# Patient Record
Sex: Female | Born: 1945 | Race: White | Hispanic: No | Marital: Married
Health system: Southern US, Community
[De-identification: ages and names within clinical notes are randomized; demographics above are authoritative.]

---

## 2018-04-28 ENCOUNTER — Other Ambulatory Visit (HOSPITAL_COMMUNITY): Payer: Medicare Other

## 2018-04-28 ENCOUNTER — Inpatient Hospital Stay
Admission: AD | Admit: 2018-04-28 | Discharge: 2018-06-07 | Disposition: A | Discharge status: Home Health | Payer: Medicare Other | Source: Ambulatory Visit | Attending: Internal Medicine | Admitting: Internal Medicine

## 2018-04-28 DIAGNOSIS — N1330 Unspecified hydronephrosis: Secondary | ICD-10-CM

## 2018-04-28 DIAGNOSIS — T8149XA Infection following a procedure, other surgical site, initial encounter: Secondary | ICD-10-CM

## 2018-04-28 DIAGNOSIS — R109 Unspecified abdominal pain: Secondary | ICD-10-CM

## 2018-04-28 DIAGNOSIS — Z4803 Encounter for change or removal of drains: Secondary | ICD-10-CM

## 2018-04-28 DIAGNOSIS — T8143XA Infection following a procedure, organ and space surgical site, initial encounter: Secondary | ICD-10-CM

## 2018-04-28 DIAGNOSIS — L0291 Cutaneous abscess, unspecified: Secondary | ICD-10-CM

## 2018-04-28 DIAGNOSIS — Z09 Encounter for follow-up examination after completed treatment for conditions other than malignant neoplasm: Secondary | ICD-10-CM

## 2018-04-29 LAB — CBC WITH DIFFERENTIAL/PLATELET
BAND NEUTROPHILS: 36 %
BASOS ABS: 0 10*3/uL (ref 0.0–0.1)
BLASTS: 0 %
Basophils Relative: 0 %
EOS ABS: 0.3 10*3/uL (ref 0.0–0.7)
Eosinophils Relative: 2 %
HCT: 28.3 % — ABNORMAL LOW (ref 36.0–46.0)
Hemoglobin: 8.8 g/dL — ABNORMAL LOW (ref 12.0–15.0)
Lymphocytes Relative: 8 %
Lymphs Abs: 1.2 10*3/uL (ref 0.7–4.0)
MCH: 28.7 pg (ref 26.0–34.0)
MCHC: 31.1 g/dL (ref 30.0–36.0)
MCV: 92.2 fL (ref 78.0–100.0)
METAMYELOCYTES PCT: 0 %
MONOS PCT: 6 %
MYELOCYTES: 0 %
Monocytes Absolute: 0.9 10*3/uL (ref 0.1–1.0)
NEUTROS ABS: 12.3 10*3/uL — AB (ref 1.7–7.7)
Neutrophils Relative %: 48 %
Other: 0 %
PLATELETS: 719 10*3/uL — AB (ref 150–400)
Promyelocytes Relative: 0 %
RBC: 3.07 MIL/uL — ABNORMAL LOW (ref 3.87–5.11)
RDW: 15.8 % — ABNORMAL HIGH (ref 11.5–15.5)
WBC: 14.7 10*3/uL — ABNORMAL HIGH (ref 4.0–10.5)
nRBC: 3 /100 WBC — ABNORMAL HIGH

## 2018-04-29 LAB — COMPREHENSIVE METABOLIC PANEL
ALK PHOS: 58 U/L (ref 38–126)
ALT: 17 U/L (ref 14–54)
AST: 19 U/L (ref 15–41)
Albumin: 1.2 g/dL — ABNORMAL LOW (ref 3.5–5.0)
Anion gap: 9 (ref 5–15)
BILIRUBIN TOTAL: 0.4 mg/dL (ref 0.3–1.2)
BUN: 10 mg/dL (ref 6–20)
CALCIUM: 7.3 mg/dL — AB (ref 8.9–10.3)
CO2: 26 mmol/L (ref 22–32)
CREATININE: 0.41 mg/dL — AB (ref 0.44–1.00)
Chloride: 101 mmol/L (ref 101–111)
GFR calc Af Amer: >60 mL/min (ref >60)
Glucose, Bld: 159 mg/dL — ABNORMAL HIGH (ref 65–99)
Potassium: 3.8 mmol/L (ref 3.5–5.1)
Sodium: 136 mmol/L (ref 135–145)
TOTAL PROTEIN: 4.6 g/dL — AB (ref 6.5–8.1)

## 2018-04-30 LAB — TRIGLYCERIDES: TRIGLYCERIDES: 102 mg/dL (ref <150)

## 2018-04-30 LAB — COMPREHENSIVE METABOLIC PANEL
ALT: 14 U/L (ref 14–54)
ANION GAP: 7 (ref 5–15)
AST: 18 U/L (ref 15–41)
Albumin: 1 g/dL — ABNORMAL LOW (ref 3.5–5.0)
Alkaline Phosphatase: 57 U/L (ref 38–126)
BUN: 10 mg/dL (ref 6–20)
CHLORIDE: 102 mmol/L (ref 101–111)
CO2: 28 mmol/L (ref 22–32)
Calcium: 7.5 mg/dL — ABNORMAL LOW (ref 8.9–10.3)
Creatinine, Ser: 0.44 mg/dL (ref 0.44–1.00)
GFR calc non Af Amer: >60 mL/min (ref >60)
Glucose, Bld: 189 mg/dL — ABNORMAL HIGH (ref 65–99)
Potassium: 4 mmol/L (ref 3.5–5.1)
SODIUM: 137 mmol/L (ref 135–145)
Total Bilirubin: 0.1 mg/dL — ABNORMAL LOW (ref 0.3–1.2)
Total Protein: 4.6 g/dL — ABNORMAL LOW (ref 6.5–8.1)

## 2018-04-30 LAB — MAGNESIUM: Magnesium: 1.7 mg/dL (ref 1.7–2.4)

## 2018-04-30 LAB — PHOSPHORUS: PHOSPHORUS: 3.2 mg/dL (ref 2.5–4.6)

## 2018-04-30 MED ORDER — PANTOPRAZOLE SODIUM 40 MG IV SOLR
40.00 | INTRAVENOUS | Status: DC
Start: 2018-04-29 — End: 2018-04-30

## 2018-04-30 MED ORDER — ONDANSETRON 4 MG PO TBDP
4.00 | ORAL_TABLET | ORAL | Status: DC
Start: ? — End: 2018-04-30

## 2018-04-30 MED ORDER — GENERIC EXTERNAL MEDICATION
150.00 | Status: DC
Start: 2018-04-28 — End: 2018-04-30

## 2018-04-30 MED ORDER — METOPROLOL TARTRATE 25 MG PO TABS
12.50 | ORAL_TABLET | ORAL | Status: DC
Start: 2018-04-28 — End: 2018-04-30

## 2018-04-30 MED ORDER — LORAZEPAM 1 MG PO TABS
1.00 | ORAL_TABLET | ORAL | Status: DC
Start: 2018-04-28 — End: 2018-04-30

## 2018-04-30 MED ORDER — DEXTROSE 10 % IV SOLN
125.00 | INTRAVENOUS | Status: DC
Start: ? — End: 2018-04-30

## 2018-04-30 MED ORDER — GENERIC EXTERNAL MEDICATION
Status: DC
Start: ? — End: 2018-04-30

## 2018-04-30 MED ORDER — ACETAMINOPHEN 500 MG PO TABS
1000.00 | ORAL_TABLET | ORAL | Status: DC
Start: 2018-04-28 — End: 2018-04-30

## 2018-04-30 MED ORDER — PNEUMOCOCCAL 13-VAL CONJ VACC IM SUSP
0.50 | INTRAMUSCULAR | Status: DC
Start: ? — End: 2018-04-30

## 2018-04-30 MED ORDER — OXYCODONE HCL 5 MG PO TABS
5.00 | ORAL_TABLET | ORAL | Status: DC
Start: ? — End: 2018-04-30

## 2018-04-30 MED ORDER — ENOXAPARIN SODIUM 40 MG/0.4ML ~~LOC~~ SOLN
40.00 | SUBCUTANEOUS | Status: DC
Start: 2018-04-29 — End: 2018-04-30

## 2018-04-30 MED ORDER — INSULIN LISPRO 100 UNIT/ML ~~LOC~~ SOLN
2.00 | SUBCUTANEOUS | Status: DC
Start: 2018-04-28 — End: 2018-04-30

## 2018-04-30 MED ORDER — SODIUM CHLORIDE 0.9 % IJ SOLN
20.00 | INTRAMUSCULAR | Status: DC
Start: 2018-04-29 — End: 2018-04-30

## 2018-04-30 MED ORDER — GENERIC EXTERNAL MEDICATION
2.00 g | Status: DC
Start: 2018-04-29 — End: 2018-04-30

## 2018-04-30 MED ORDER — METRONIDAZOLE IN NACL 5-0.79 MG/ML-% IV SOLN
500.00 | INTRAVENOUS | Status: DC
Start: 2018-04-28 — End: 2018-04-30

## 2018-04-30 MED ORDER — SERTRALINE HCL 100 MG PO TABS
100.00 | ORAL_TABLET | ORAL | Status: DC
Start: 2018-04-29 — End: 2018-04-30

## 2018-04-30 MED ORDER — GENERIC EXTERNAL MEDICATION
5.00 | Status: DC
Start: ? — End: 2018-04-30

## 2018-04-30 MED ORDER — SODIUM CHLORIDE 0.9 % IJ SOLN
20.00 | INTRAMUSCULAR | Status: DC
Start: ? — End: 2018-04-30

## 2018-04-30 MED ORDER — GENERIC EXTERNAL MEDICATION
.50 | Status: DC
Start: ? — End: 2018-04-30

## 2018-05-01 LAB — BASIC METABOLIC PANEL
Anion gap: 5 (ref 5–15)
BUN: 12 mg/dL (ref 6–20)
CALCIUM: 7.5 mg/dL — AB (ref 8.9–10.3)
CO2: 28 mmol/L (ref 22–32)
CREATININE: 0.38 mg/dL — AB (ref 0.44–1.00)
Chloride: 104 mmol/L (ref 101–111)
GFR calc Af Amer: >60 mL/min (ref >60)
Glucose, Bld: 199 mg/dL — ABNORMAL HIGH (ref 65–99)
Potassium: 3.9 mmol/L (ref 3.5–5.1)
Sodium: 137 mmol/L (ref 135–145)

## 2018-05-02 LAB — BASIC METABOLIC PANEL
Anion gap: 7 (ref 5–15)
BUN: 11 mg/dL (ref 6–20)
CALCIUM: 7.7 mg/dL — AB (ref 8.9–10.3)
CO2: 29 mmol/L (ref 22–32)
CREATININE: 0.39 mg/dL — AB (ref 0.44–1.00)
Chloride: 102 mmol/L (ref 101–111)
GFR calc Af Amer: >60 mL/min (ref >60)
Glucose, Bld: 145 mg/dL — ABNORMAL HIGH (ref 65–99)
Potassium: 4.1 mmol/L (ref 3.5–5.1)
Sodium: 138 mmol/L (ref 135–145)

## 2018-05-03 LAB — COMPREHENSIVE METABOLIC PANEL
ALT: 12 U/L — AB (ref 14–54)
AST: 16 U/L (ref 15–41)
Albumin: 1.2 g/dL — ABNORMAL LOW (ref 3.5–5.0)
Alkaline Phosphatase: 67 U/L (ref 38–126)
Anion gap: 7 (ref 5–15)
BILIRUBIN TOTAL: 0.1 mg/dL — AB (ref 0.3–1.2)
BUN: 14 mg/dL (ref 6–20)
CHLORIDE: 100 mmol/L — AB (ref 101–111)
CO2: 29 mmol/L (ref 22–32)
CREATININE: 0.37 mg/dL — AB (ref 0.44–1.00)
Calcium: 7.5 mg/dL — ABNORMAL LOW (ref 8.9–10.3)
Glucose, Bld: 155 mg/dL — ABNORMAL HIGH (ref 65–99)
POTASSIUM: 4.2 mmol/L (ref 3.5–5.1)
Sodium: 136 mmol/L (ref 135–145)
Total Protein: 4.5 g/dL — ABNORMAL LOW (ref 6.5–8.1)

## 2018-05-03 LAB — TRIGLYCERIDES: Triglycerides: 77 mg/dL (ref <150)

## 2018-05-03 LAB — MAGNESIUM: MAGNESIUM: 1.6 mg/dL — AB (ref 1.7–2.4)

## 2018-05-03 LAB — PHOSPHORUS: PHOSPHORUS: 3.2 mg/dL (ref 2.5–4.6)

## 2018-05-06 LAB — COMPREHENSIVE METABOLIC PANEL
ALK PHOS: 63 U/L (ref 38–126)
ALT: 10 U/L — AB (ref 14–54)
AST: 13 U/L — AB (ref 15–41)
Albumin: 1.2 g/dL — ABNORMAL LOW (ref 3.5–5.0)
Anion gap: 7 (ref 5–15)
BUN: 11 mg/dL (ref 6–20)
CALCIUM: 7.6 mg/dL — AB (ref 8.9–10.3)
CO2: 30 mmol/L (ref 22–32)
CREATININE: 0.44 mg/dL (ref 0.44–1.00)
Chloride: 99 mmol/L — ABNORMAL LOW (ref 101–111)
GFR calc non Af Amer: >60 mL/min (ref >60)
GLUCOSE: 163 mg/dL — AB (ref 65–99)
Potassium: 4 mmol/L (ref 3.5–5.1)
Sodium: 136 mmol/L (ref 135–145)
Total Bilirubin: 0.2 mg/dL — ABNORMAL LOW (ref 0.3–1.2)
Total Protein: 4.6 g/dL — ABNORMAL LOW (ref 6.5–8.1)

## 2018-05-06 LAB — PHOSPHORUS: PHOSPHORUS: 3.5 mg/dL (ref 2.5–4.6)

## 2018-05-06 LAB — TRIGLYCERIDES: TRIGLYCERIDES: 52 mg/dL (ref <150)

## 2018-05-06 LAB — MAGNESIUM: Magnesium: 1.6 mg/dL — ABNORMAL LOW (ref 1.7–2.4)

## 2018-05-08 ENCOUNTER — Other Ambulatory Visit (HOSPITAL_COMMUNITY): Payer: Medicare Other

## 2018-05-08 MED ORDER — IOHEXOL 300 MG/ML  SOLN
100.0000 mL | Freq: Once | INTRAMUSCULAR | Status: AC | PRN
  Administered 2018-05-08: 100 mL via INTRAVENOUS

## 2018-05-08 MED ORDER — IOPAMIDOL (ISOVUE-300) INJECTION 61%
INTRAVENOUS | Status: AC
  Filled 2018-05-08: qty 30

## 2018-05-09 LAB — COMPREHENSIVE METABOLIC PANEL
ALT: 8 U/L — ABNORMAL LOW (ref 14–54)
AST: 13 U/L — ABNORMAL LOW (ref 15–41)
Albumin: 1.4 g/dL — ABNORMAL LOW (ref 3.5–5.0)
Alkaline Phosphatase: 72 U/L (ref 38–126)
Anion gap: 8 (ref 5–15)
BUN: 14 mg/dL (ref 6–20)
CHLORIDE: 98 mmol/L — AB (ref 101–111)
CO2: 30 mmol/L (ref 22–32)
Calcium: 8 mg/dL — ABNORMAL LOW (ref 8.9–10.3)
Creatinine, Ser: 0.46 mg/dL (ref 0.44–1.00)
GFR calc non Af Amer: >60 mL/min (ref >60)
Glucose, Bld: 151 mg/dL — ABNORMAL HIGH (ref 65–99)
POTASSIUM: 4.2 mmol/L (ref 3.5–5.1)
SODIUM: 136 mmol/L (ref 135–145)
Total Bilirubin: 0.3 mg/dL (ref 0.3–1.2)
Total Protein: 5.2 g/dL — ABNORMAL LOW (ref 6.5–8.1)

## 2018-05-09 LAB — CBC
HCT: 26.9 % — ABNORMAL LOW (ref 36.0–46.0)
HEMOGLOBIN: 8.2 g/dL — AB (ref 12.0–15.0)
MCH: 27.2 pg (ref 26.0–34.0)
MCHC: 30.5 g/dL (ref 30.0–36.0)
MCV: 89.1 fL (ref 78.0–100.0)
Platelets: 763 10*3/uL — ABNORMAL HIGH (ref 150–400)
RBC: 3.02 MIL/uL — ABNORMAL LOW (ref 3.87–5.11)
RDW: 15.5 % (ref 11.5–15.5)
WBC: 11.9 10*3/uL — ABNORMAL HIGH (ref 4.0–10.5)

## 2018-05-09 LAB — MAGNESIUM: Magnesium: 1.6 mg/dL — ABNORMAL LOW (ref 1.7–2.4)

## 2018-05-09 LAB — PHOSPHORUS: PHOSPHORUS: 3.9 mg/dL (ref 2.5–4.6)

## 2018-05-09 LAB — TRIGLYCERIDES: Triglycerides: 62 mg/dL (ref <150)

## 2018-05-09 NOTE — Progress Notes (Signed)
IR consulted for drain repositioning.  Patient with several drains recently placed at [facility].  CT Abdomen/Pelvis reviewed by Dr. Bonnielee Haff.  Note possible mild mass-effect from right-sided drain.  Dr. Bonnielee Haff recommends continuing aggressive flushes for now and monitor for improvement.   Ordering MD aware.  Loyce Dys, MS RD PA-C 4:11 PM

## 2018-05-10 LAB — BASIC METABOLIC PANEL
ANION GAP: 8 (ref 5–15)
BUN: 11 mg/dL (ref 6–20)
CALCIUM: 7.8 mg/dL — AB (ref 8.9–10.3)
CO2: 28 mmol/L (ref 22–32)
Chloride: 99 mmol/L — ABNORMAL LOW (ref 101–111)
Creatinine, Ser: 0.44 mg/dL (ref 0.44–1.00)
GFR calc non Af Amer: >60 mL/min (ref >60)
Glucose, Bld: 191 mg/dL — ABNORMAL HIGH (ref 65–99)
Potassium: 4 mmol/L (ref 3.5–5.1)
SODIUM: 135 mmol/L (ref 135–145)

## 2018-05-12 LAB — COMPREHENSIVE METABOLIC PANEL
ALBUMIN: 1.4 g/dL — AB (ref 3.5–5.0)
ALT: 8 U/L — AB (ref 14–54)
AST: 12 U/L — ABNORMAL LOW (ref 15–41)
Alkaline Phosphatase: 73 U/L (ref 38–126)
Anion gap: 7 (ref 5–15)
BUN: 11 mg/dL (ref 6–20)
CHLORIDE: 100 mmol/L — AB (ref 101–111)
CO2: 29 mmol/L (ref 22–32)
CREATININE: 0.35 mg/dL — AB (ref 0.44–1.00)
Calcium: 7.8 mg/dL — ABNORMAL LOW (ref 8.9–10.3)
GFR calc Af Amer: >60 mL/min (ref >60)
GFR calc non Af Amer: >60 mL/min (ref >60)
Glucose, Bld: 146 mg/dL — ABNORMAL HIGH (ref 65–99)
Potassium: 4.1 mmol/L (ref 3.5–5.1)
SODIUM: 136 mmol/L (ref 135–145)
Total Bilirubin: 0.4 mg/dL (ref 0.3–1.2)
Total Protein: 5 g/dL — ABNORMAL LOW (ref 6.5–8.1)

## 2018-05-12 LAB — MAGNESIUM: Magnesium: 1.6 mg/dL — ABNORMAL LOW (ref 1.7–2.4)

## 2018-05-12 LAB — PHOSPHORUS: PHOSPHORUS: 3.8 mg/dL (ref 2.5–4.6)

## 2018-05-12 LAB — TRIGLYCERIDES: Triglycerides: 49 mg/dL (ref <150)

## 2018-05-14 ENCOUNTER — Other Ambulatory Visit (HOSPITAL_COMMUNITY): Payer: Medicare Other

## 2018-05-14 MED ORDER — IOHEXOL 300 MG/ML  SOLN
100.0000 mL | Freq: Once | INTRAMUSCULAR | Status: AC | PRN
  Administered 2018-05-14: 100 mL via INTRAVENOUS

## 2018-05-15 LAB — COMPREHENSIVE METABOLIC PANEL
ALK PHOS: 75 U/L (ref 38–126)
ALT: 7 U/L — ABNORMAL LOW (ref 14–54)
ANION GAP: 6 (ref 5–15)
AST: 12 U/L — ABNORMAL LOW (ref 15–41)
Albumin: 1.5 g/dL — ABNORMAL LOW (ref 3.5–5.0)
BILIRUBIN TOTAL: 0.3 mg/dL (ref 0.3–1.2)
BUN: 12 mg/dL (ref 6–20)
CALCIUM: 7.8 mg/dL — AB (ref 8.9–10.3)
CO2: 30 mmol/L (ref 22–32)
Chloride: 98 mmol/L — ABNORMAL LOW (ref 101–111)
Creatinine, Ser: 0.38 mg/dL — ABNORMAL LOW (ref 0.44–1.00)
GFR calc non Af Amer: >60 mL/min (ref >60)
Glucose, Bld: 145 mg/dL — ABNORMAL HIGH (ref 65–99)
Potassium: 4.1 mmol/L (ref 3.5–5.1)
Sodium: 134 mmol/L — ABNORMAL LOW (ref 135–145)
TOTAL PROTEIN: 5.1 g/dL — AB (ref 6.5–8.1)

## 2018-05-15 LAB — CBC WITH DIFFERENTIAL/PLATELET
BASOS ABS: 0.1 10*3/uL (ref 0.0–0.1)
Basophils Relative: 1 %
EOS ABS: 0.1 10*3/uL (ref 0.0–0.7)
Eosinophils Relative: 1 %
HCT: 26.3 % — ABNORMAL LOW (ref 36.0–46.0)
Hemoglobin: 7.9 g/dL — ABNORMAL LOW (ref 12.0–15.0)
LYMPHS ABS: 1.3 10*3/uL (ref 0.7–4.0)
Lymphocytes Relative: 11 %
MCH: 26.2 pg (ref 26.0–34.0)
MCHC: 30 g/dL (ref 30.0–36.0)
MCV: 87.4 fL (ref 78.0–100.0)
Monocytes Absolute: 1.6 10*3/uL — ABNORMAL HIGH (ref 0.1–1.0)
Monocytes Relative: 14 %
Neutro Abs: 8.5 10*3/uL — ABNORMAL HIGH (ref 1.7–7.7)
Neutrophils Relative %: 73 %
PLATELETS: 812 10*3/uL — AB (ref 150–400)
RBC: 3.01 MIL/uL — AB (ref 3.87–5.11)
RDW: 16.4 % — ABNORMAL HIGH (ref 11.5–15.5)
WBC: 11.6 10*3/uL — AB (ref 4.0–10.5)

## 2018-05-15 LAB — MAGNESIUM: MAGNESIUM: 1.6 mg/dL — AB (ref 1.7–2.4)

## 2018-05-15 LAB — PHOSPHORUS: PHOSPHORUS: 3.9 mg/dL (ref 2.5–4.6)

## 2018-05-15 LAB — TRIGLYCERIDES: Triglycerides: 69 mg/dL (ref <150)

## 2018-05-18 ENCOUNTER — Other Ambulatory Visit (HOSPITAL_COMMUNITY): Payer: Medicare Other

## 2018-05-21 ENCOUNTER — Encounter (HOSPITAL_COMMUNITY): Payer: Self-pay | Admitting: Interventional Radiology

## 2018-05-21 ENCOUNTER — Other Ambulatory Visit (HOSPITAL_COMMUNITY): Payer: Medicare Other

## 2018-05-21 HISTORY — PX: IR SINUS/FIST TUBE CHK-NON GI: IMG673

## 2018-05-21 LAB — CBC
HEMATOCRIT: 29.3 % — AB (ref 36.0–46.0)
Hemoglobin: 8.7 g/dL — ABNORMAL LOW (ref 12.0–15.0)
MCH: 25.6 pg — ABNORMAL LOW (ref 26.0–34.0)
MCHC: 29.7 g/dL — ABNORMAL LOW (ref 30.0–36.0)
MCV: 86.2 fL (ref 78.0–100.0)
Platelets: 1005 10*3/uL (ref 150–400)
RBC: 3.4 MIL/uL — AB (ref 3.87–5.11)
RDW: 16.7 % — AB (ref 11.5–15.5)
WBC: 13.7 10*3/uL — AB (ref 4.0–10.5)

## 2018-05-21 LAB — BASIC METABOLIC PANEL
ANION GAP: 7 (ref 5–15)
BUN: 15 mg/dL (ref 6–20)
CALCIUM: 8.3 mg/dL — AB (ref 8.9–10.3)
CO2: 28 mmol/L (ref 22–32)
Chloride: 102 mmol/L (ref 101–111)
Creatinine, Ser: 0.42 mg/dL — ABNORMAL LOW (ref 0.44–1.00)
Glucose, Bld: 146 mg/dL — ABNORMAL HIGH (ref 65–99)
Potassium: 4.1 mmol/L (ref 3.5–5.1)
Sodium: 137 mmol/L (ref 135–145)

## 2018-05-21 MED ORDER — IOPAMIDOL (ISOVUE-300) INJECTION 61%
INTRAVENOUS | Status: AC
  Administered 2018-05-21: 25 mL
  Filled 2018-05-21: qty 50

## 2018-05-21 NOTE — Procedures (Signed)
Pre procedural Dx: Post-op Abscess Post procedural Dx: Same  Fluoro guided injection of right mid abdominal percutaneous drain is negative for evidence of fistulous connection between the presumed decompressed abscess cavity and either the adjacent intestines or biliary system.  PLAN:  - Given report of persistent high volume daily output from the percutaneous drainage catheter estimated approximately 300 - 500 cc per day, drainage catheter removal is NOT advised at this time.  - As such, we will convert the patient's drainage catheter suction canister to a gravity bag and will continue to monitor the daily output from the drainage catheter.    - If the drainage catheter output demonstrates a precipitous drop-off in the coming days, the drainage catheter may be removed at that time.    - If drainage catheter output remains voluminous, would recommend repeat CT scan and potential repeat drainage catheter injection prior to consideration of drainage catheter removal.   Katherina RightJay Diamante Truszkowski, MD Pager #: [phone]

## 2018-05-23 LAB — PATHOLOGIST SMEAR REVIEW

## 2018-05-24 ENCOUNTER — Other Ambulatory Visit (HOSPITAL_COMMUNITY): Payer: Medicare Other

## 2018-05-24 LAB — CBC WITH DIFFERENTIAL/PLATELET
BASOS ABS: 0.2 10*3/uL — AB (ref 0.0–0.1)
Basophils Relative: 1 %
EOS ABS: 0.2 10*3/uL (ref 0.0–0.7)
EOS PCT: 1 %
HCT: 24.4 % — ABNORMAL LOW (ref 36.0–46.0)
HEMOGLOBIN: 7.3 g/dL — AB (ref 12.0–15.0)
LYMPHS PCT: 6 %
Lymphs Abs: 1.2 10*3/uL (ref 0.7–4.0)
MCH: 25.3 pg — ABNORMAL LOW (ref 26.0–34.0)
MCHC: 29.9 g/dL — ABNORMAL LOW (ref 30.0–36.0)
MCV: 84.7 fL (ref 78.0–100.0)
MONOS PCT: 13 %
Monocytes Absolute: 2.6 10*3/uL — ABNORMAL HIGH (ref 0.1–1.0)
Neutro Abs: 15.5 10*3/uL — ABNORMAL HIGH (ref 1.7–7.7)
Neutrophils Relative %: 79 %
PLATELETS: 735 10*3/uL — AB (ref 150–400)
RBC: 2.88 MIL/uL — AB (ref 3.87–5.11)
RDW: 17.2 % — ABNORMAL HIGH (ref 11.5–15.5)
WBC: 19.7 10*3/uL — AB (ref 4.0–10.5)
nRBC: 1 /100 WBC — ABNORMAL HIGH

## 2018-05-25 ENCOUNTER — Other Ambulatory Visit (HOSPITAL_COMMUNITY): Payer: Medicare Other

## 2018-05-25 MED ORDER — IOHEXOL 300 MG/ML  SOLN
100.0000 mL | Freq: Once | INTRAMUSCULAR | Status: AC | PRN
  Administered 2018-05-25: 100 mL via INTRAVENOUS

## 2018-05-26 ENCOUNTER — Other Ambulatory Visit (HOSPITAL_COMMUNITY): Payer: Medicare Other

## 2018-05-26 LAB — CBC
HEMATOCRIT: 21.5 % — AB (ref 36.0–46.0)
Hemoglobin: 6.5 g/dL — CL (ref 12.0–15.0)
MCH: 25.5 pg — ABNORMAL LOW (ref 26.0–34.0)
MCHC: 30.2 g/dL (ref 30.0–36.0)
MCV: 84.3 fL (ref 78.0–100.0)
Platelets: 656 10*3/uL — ABNORMAL HIGH (ref 150–400)
RBC: 2.55 MIL/uL — AB (ref 3.87–5.11)
RDW: 17.7 % — ABNORMAL HIGH (ref 11.5–15.5)
WBC: 21 10*3/uL — AB (ref 4.0–10.5)

## 2018-05-26 LAB — BASIC METABOLIC PANEL
ANION GAP: 8 (ref 5–15)
BUN: 15 mg/dL (ref 6–20)
CHLORIDE: 101 mmol/L (ref 101–111)
CO2: 27 mmol/L (ref 22–32)
Calcium: 7.9 mg/dL — ABNORMAL LOW (ref 8.9–10.3)
Creatinine, Ser: 0.39 mg/dL — ABNORMAL LOW (ref 0.44–1.00)
GFR calc Af Amer: >60 mL/min (ref >60)
GLUCOSE: 171 mg/dL — AB (ref 65–99)
POTASSIUM: 4.1 mmol/L (ref 3.5–5.1)
Sodium: 136 mmol/L (ref 135–145)

## 2018-05-26 LAB — ABO/RH: ABO/RH(D): A POS

## 2018-05-26 LAB — PROTIME-INR
INR: 1.23
Prothrombin Time: 15.4 seconds — ABNORMAL HIGH (ref 11.4–15.2)

## 2018-05-26 LAB — PREPARE RBC (CROSSMATCH)

## 2018-05-26 MED ORDER — FENTANYL CITRATE (PF) 100 MCG/2ML IJ SOLN
INTRAMUSCULAR | Status: AC | PRN
  Administered 2018-05-26: 25 ug via INTRAVENOUS
  Administered 2018-05-26: 50 ug via INTRAVENOUS

## 2018-05-26 MED ORDER — MIDAZOLAM HCL 2 MG/2ML IJ SOLN
INTRAMUSCULAR | Status: AC | PRN
  Administered 2018-05-26: 0.5 mg via INTRAVENOUS
  Administered 2018-05-26: 1 mg via INTRAVENOUS
  Administered 2018-05-26: 0.5 mg via INTRAVENOUS

## 2018-05-26 MED ORDER — FENTANYL CITRATE (PF) 100 MCG/2ML IJ SOLN
INTRAMUSCULAR | Status: AC
  Filled 2018-05-26: qty 4

## 2018-05-26 MED ORDER — MIDAZOLAM HCL 2 MG/2ML IJ SOLN
INTRAMUSCULAR | Status: AC
  Filled 2018-05-26: qty 4

## 2018-05-26 NOTE — Sedation Documentation (Signed)
Vital signs stable. 

## 2018-05-26 NOTE — Sedation Documentation (Signed)
Patient is resting comfortably. 

## 2018-05-26 NOTE — Procedures (Signed)
CT guided drain placement in lower abdominal/pelvic air-fluid collection.   Removed 80 ml of dark green/gray fluid.  Minimal blood loss and no immediate complication.

## 2018-05-26 NOTE — Sedation Documentation (Signed)
ED Provider at bedside. 

## 2018-05-26 NOTE — Progress Notes (Signed)
Referring Physician(s): Brown,S  Supervising Physician: Richarda Overlie  Patient Status:  [name]  Chief Complaint:  Abdominal abscesses  Subjective: Patient familiar to IR service from recent abdominal abscess drain injection on 05/21/2018.  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  She underwent placement of both left and right abdominal drains for abscesses.  She recently had a left abdominal drain removed. She continues to have abdominal pain and intermittent fevers and underwent CT of abdomen pelvis yesterday which revealed large recurrent abscess in the mid abdominal/pelvic region.  Request now received for drainage of this abscess.  She currently denies headache, chest pain, worsening dyspnea, cough, back pain, nausea, vomiting or bleeding.  Allergies: Patient has no allergy information on record.  Medications: Prior to Admission medications   Not on File     Vital Signs: There were no vitals taken for this visit.  Physical Exam awake, alert.  Chest with diminished breath sounds at bases.  Heart with regular rate and rhythm.  Abdomen soft, positive bowel sounds, midline vertical wound covered with bandage.  Right abdominal drain with bilious appearing output, left abdominal drain with dark/black fluid in JP bulb. No LE edema.  Imaging: Ct Abdomen Pelvis W Contrast  Result Date: 05/25/2018 CLINICAL DATA:  Abdominal pain and fever. Patient with history of intra-abdominal collections and drains. Increased white blood cell count. EXAM: CT ABDOMEN AND PELVIS WITH CONTRAST TECHNIQUE: Multidetector CT imaging of the abdomen and pelvis was performed using the standard protocol following bolus administration of intravenous contrast. CONTRAST:  OMNIPAQUE IOHEXOL 300 MG/ML  SOLN COMPARISON:  CT 05/14/2018 FINDINGS: Lower chest: Small left pleural effusion  and adjacent atelectasis, slightly improved from prior exam. Mild right lung base atelectasis. Hepatobiliary: Subcentimeter low-density in the central liver is unchanged. No new focal lesion. Gallbladder not confidently visualized. Pancreas: No ductal dilatation or inflammation. Tiny 4 mm low-density cystic structure in the tail is unchanged. Spleen: Prior splenectomy. Possible splenosis in the left upper quadrant versus pancreatic tail. Adrenals/Urinary Tract: No adrenal nodule. Mild right hydronephrosis and proximal ureteral dilatation to the pelvic brim, however there is symmetric renal enhancement and excretion on delayed phase imaging. The ureter distal to the pelvic brim is not well evaluated. Nonobstructing stone in the lower left kidney. Urinary bladder is physiologically distended. Stomach/Bowel: Postsurgical change in lack of enteric contrast significantly limits bowel evaluation. Bowel anatomy is not well-defined. Stomach is nondistended. Fluid-filled prominent but not particularly dilated small bowel in the central abdomen. Enteric sutures noted in the transverse colon. Liquid stool/fluid-filled colon without colonic wall thickening or inflammatory change. There is an apparent colonic wall defect within the distal sigmoid colon, coronal image 56 and sagittal image 51, with apparent extraluminal air and fluid collection, this collection measures approximately 6.3 x 3.4 x 7.5 cm. This is in the region of prior pelvic drain, which has been removed in the interim. Vascular/Lymphatic: Aortic atherosclerosis. Circumaortic left renal vein. Overall limited assessment for adenopathy. Reproductive: Uterus is not visualized. Other: Large peripherally enhancing U shaped pelvic fluid collection measures at least 15.5 x 4.1 x 4.9 cm, in the region of prior surgical drain consistent with abscess. This fluid collection tracks into the left pericolic gutter and left upper quadrant, and is in continuity with the left  upper quadrant drainage catheter. Abscess cavity in the left upper quadrant measures 8.6 x 4.7 cm. Right lateral aspect of the  fluid collection tracks into the right pericolic gutter. Separate drainage catheter in the right upper quadrant has small residual fluid collection measuring 2.5 x 2.0 cm. There is scattered free air in the anterior upper abdomen. Fluid tracks into both inguinal canals, sterility indeterminate by CT. Midline skin staples. Musculoskeletal: Degenerative change in the spine. No focal bone lesion. IMPRESSION: 1. Large recurrent pelvic abscess after drainage catheter removal. The abscess is U shaped and measures at least 15 x 4 x 5 cm. The left lateral aspect of this abscess is in continuity with a fluid collection tracking in the pericolic gutter into the left upper quadrant. There is a drainage catheter within the left upper quadrant fluid collection with residual abscess cavity of 8.6 x 4.7 cm. 2. Right upper quadrant drainage catheter with small residual adjacent fluid collection measuring 2.5 x 2.0 cm. 3. Deep pelvic abscess posterior to the bladder measures 6.3 x 3.4 x 7.5 cm. 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 4. Persistent mild right hydroureteronephrosis to the pelvic brim, raising possibility of ureteral stricture. These results will be called to the ordering clinician or representative by the Radiologist Assistant, and communication documented in the PACS or zVision Dashboard. Electronically Signed   By: Rubye Oaks M.D.   On: 05/25/2018 01:16    Labs:  CBC: Recent Labs    05/15/18 0514 05/21/18 0934 05/24/18 1620 05/26/18 0615  WBC 11.6* 13.7* 19.7* 21.0*  HGB 7.9* 8.7* 7.3* 6.5*  HCT 26.3* 29.3* 24.4* 21.5*  PLT 812* 1,005* 735* 656*    COAGS: Recent Labs    05/26/18 0648  INR 1.23    BMP: Recent Labs    05/12/18 0530 05/15/18 0514 05/21/18 0934 05/26/18 0615  NA 136 134* 137 136   K 4.1 4.1 4.1 4.1  CL 100* 98* 102 101  CO2 29 30 28 27   GLUCOSE 146* 145* 146* 171*  BUN 11 12 15 15   CALCIUM 7.8* 7.8* 8.3* 7.9*  CREATININE 0.35* 0.38* 0.42* 0.39*  GFRNONAA >60 >60 >60 >60  GFRAA >60 >60 >60 >60    LIVER FUNCTION TESTS: Recent Labs    05/06/18 0646 05/09/18 0535 05/12/18 0530 05/15/18 0514  BILITOT 0.2* 0.3 0.4 0.3  AST 13* 13* 12* 12*  ALT 10* 8* 8* 7*  ALKPHOS 63 72 73 75  PROT 4.6* 5.2* 5.0* 5.1*  ALBUMIN 1.2* 1.4* 1.4* 1.5*    Assessment and Plan: Pt with prior history of appendiceal carcinoma, status post resection with 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 Also with history of previous right and left abdominal abscess drain placements and recent removal of a left-sided abd drain; now with persistent abdominal pain, intermittent fever, nausea, worsening leukocytosis and CT evidence of a large recurrent abscess in the mid pelvic region.  Request now received for image guided drainage of this abscess.  Latest imaging studies have been reviewed by Dr. Lowella Dandy. Risks and benefits discussed with the patient /spouse including bleeding, infection, damage to adjacent structures, bowel perforation/fistula connection, and sepsis.  Hemoglobin today 6.5.  Patient has been transfused.  All of the patient's questions were answered, patient is agreeable to proceed. Consent signed and in chart.  Procedure tentatively scheduled for later today.   Electronically Signed: D. Jeananne Rama, PA-C 05/26/2018, 10:04 AM   I spent a total of 25 minutes at the the patient's bedside AND on the patient's hospital floor or  unit, greater than 50% of which was counseling/coordinating care for CT-guided abdominal/pelvic abscess drain    Patient ID: Summer Espinoza, female   DOB: 08/23/1946, 72 y.o.   MRN: [id number]

## 2018-05-27 LAB — BASIC METABOLIC PANEL
ANION GAP: 6 (ref 5–15)
BUN: 13 mg/dL (ref 6–20)
CHLORIDE: 102 mmol/L (ref 101–111)
CO2: 29 mmol/L (ref 22–32)
Calcium: 7.7 mg/dL — ABNORMAL LOW (ref 8.9–10.3)
Creatinine, Ser: 0.41 mg/dL — ABNORMAL LOW (ref 0.44–1.00)
Glucose, Bld: 145 mg/dL — ABNORMAL HIGH (ref 65–99)
POTASSIUM: 4.2 mmol/L (ref 3.5–5.1)
SODIUM: 137 mmol/L (ref 135–145)

## 2018-05-27 LAB — CBC
HCT: 26.5 % — ABNORMAL LOW (ref 36.0–46.0)
Hemoglobin: 8.1 g/dL — ABNORMAL LOW (ref 12.0–15.0)
MCH: 25.8 pg — ABNORMAL LOW (ref 26.0–34.0)
MCHC: 30.6 g/dL (ref 30.0–36.0)
MCV: 84.4 fL (ref 78.0–100.0)
Platelets: 667 10*3/uL — ABNORMAL HIGH (ref 150–400)
RBC: 3.14 MIL/uL — AB (ref 3.87–5.11)
RDW: 17.6 % — AB (ref 11.5–15.5)
WBC: 22.6 10*3/uL — AB (ref 4.0–10.5)

## 2018-05-27 LAB — BPAM RBC
BLOOD PRODUCT EXPIRATION DATE: 201906282359
ISSUE DATE / TIME: 201906061047
Unit Type and Rh: 6200

## 2018-05-27 LAB — TYPE AND SCREEN
ABO/RH(D): A POS
ANTIBODY SCREEN: NEGATIVE
Unit division: 0

## 2018-05-27 NOTE — Progress Notes (Signed)
Referring Physician(s): Dr. Manson Passey  Supervising Physician: Richarda Overlie  Patient Status:  [facility] - In-pt  Chief Complaint: Abdominal abscesses  Subjective: Resting comfortably, arouses easily.  Patient s/p abdominal abscess drain placement 6/6. Still with multiple other drains as well.   Allergies: Patient has no allergy information on record.  Medications: Prior to Admission medications   Not on File     Vital Signs: BP (!) 96/49   Pulse 84   Resp 18   SpO2 98%   Physical Exam  NAD, alert, communicative Abdomen: multiple abdominal drain in place.  RLQ drain placed by IR yesterday which terminates in the left abdomen.  Site intact.  Output remains dark gray.     Imaging: Ct Abdomen Pelvis W Contrast  Result Date: 05/25/2018 CLINICAL DATA:  Abdominal pain and fever. Patient with history of intra-abdominal collections and drains. Increased white blood cell count. EXAM: CT ABDOMEN AND PELVIS WITH CONTRAST TECHNIQUE: Multidetector CT imaging of the abdomen and pelvis was performed using the standard protocol following bolus administration of intravenous contrast. CONTRAST:  OMNIPAQUE IOHEXOL 300 MG/ML  SOLN COMPARISON:  CT 05/14/2018 FINDINGS: Lower chest: Small left pleural effusion and adjacent atelectasis, slightly improved from prior exam. Mild right lung base atelectasis. Hepatobiliary: Subcentimeter low-density in the central liver is unchanged. No new focal lesion. Gallbladder not confidently visualized. Pancreas: No ductal dilatation or inflammation. Tiny 4 mm low-density cystic structure in the tail is unchanged. Spleen: Prior splenectomy. Possible splenosis in the left upper quadrant versus pancreatic tail. Adrenals/Urinary Tract: No adrenal nodule. Mild right hydronephrosis and proximal ureteral dilatation to the pelvic brim, however there is symmetric renal enhancement and excretion on delayed phase imaging. The ureter distal to the pelvic brim is not well evaluated.  Nonobstructing stone in the lower left kidney. Urinary bladder is physiologically distended. Stomach/Bowel: Postsurgical change in lack of enteric contrast significantly limits bowel evaluation. Bowel anatomy is not well-defined. Stomach is nondistended. Fluid-filled prominent but not particularly dilated small bowel in the central abdomen. Enteric sutures noted in the transverse colon. Liquid stool/fluid-filled colon without colonic wall thickening or inflammatory change. There is an apparent colonic wall defect within the distal sigmoid colon, coronal image 56 and sagittal image 51, with apparent extraluminal air and fluid collection, this collection measures approximately 6.3 x 3.4 x 7.5 cm. This is in the region of prior pelvic drain, which has been removed in the interim. Vascular/Lymphatic: Aortic atherosclerosis. Circumaortic left renal vein. Overall limited assessment for adenopathy. Reproductive: Uterus is not visualized. Other: Large peripherally enhancing U shaped pelvic fluid collection measures at least 15.5 x 4.1 x 4.9 cm, in the region of prior surgical drain consistent with abscess. This fluid collection tracks into the left pericolic gutter and left upper quadrant, and is in continuity with the left upper quadrant drainage catheter. Abscess cavity in the left upper quadrant measures 8.6 x 4.7 cm. Right lateral aspect of the fluid collection tracks into the right pericolic gutter. Separate drainage catheter in the right upper quadrant has small residual fluid collection measuring 2.5 x 2.0 cm. There is scattered free air in the anterior upper abdomen. Fluid tracks into both inguinal canals, sterility indeterminate by CT. Midline skin staples. Musculoskeletal: Degenerative change in the spine. No focal bone lesion. IMPRESSION: 1. Large recurrent pelvic abscess after drainage catheter removal. The abscess is U shaped and measures at least 15 x 4 x 5 cm. The left lateral aspect of this abscess is in  continuity with a fluid  collection tracking in the pericolic gutter into the left upper quadrant. There is a drainage catheter within the left upper quadrant fluid collection with residual abscess cavity of 8.6 x 4.7 cm. 2. Right upper quadrant drainage catheter with small residual adjacent fluid collection measuring 2.5 x 2.0 cm. 3. Deep pelvic abscess posterior to the bladder measures 6.3 x 3.4 x 7.5 cm. 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 4. Persistent mild right hydroureteronephrosis to the pelvic brim, raising possibility of ureteral stricture. These results will be called to the ordering clinician or representative by the Radiologist Assistant, and communication documented in the PACS or zVision Dashboard. Electronically Signed   By: Rubye Oaks M.D.   On: 05/25/2018 01:16   Ct Image Guided Drainage By Percutaneous Catheter  Result Date: 05/26/2018 INDICATION: 72 year old with a complex surgical history including appendiceal carcinoma and subsequent development of intestinal anastomotic leak. Patient has surgical drains with intermittent fevers and fullness in the lower abdomen. Recent CT demonstrates a recurrent abscess in the lower abdomen. EXAM: CT-GUIDED DRAINAGE OF LOWER ABDOMINAL ABSCESS MEDICATIONS: Sedation medications. ANESTHESIA/SEDATION: 2.0 mg IV Versed 75 mcg IV Fentanyl Moderate Sedation Time:  19 minutes The patient was continuously monitored during the procedure by the interventional radiology nurse under my direct supervision. COMPLICATIONS: None immediate. TECHNIQUE: Informed written consent was obtained from the patient after a thorough discussion of the procedural risks, benefits and alternatives. All questions were addressed. A timeout was performed prior to the initiation of the procedure. PROCEDURE: Patient was placed supine on the CT scanner. Images through the lower abdomen and pelvis were obtained. The right  lower abdomen was prepped with chlorhexidine and sterile field was created. Skin and soft tissues were anesthetized with 1% lidocaine. 18 gauge trocar needle was directed into the air-fluid collection with CT guidance and dark green/gray colored fluid was aspirated. A stiff Amplatz wire was placed the tract was dilated to accommodate a 10 [place] drain. 80 mL of the fluid was removed. Catheter was attached to a suction bulb. Catheter was sutured to the skin. FINDINGS: Large air-fluid collection in the anterior lower abdomen. 80 mL of dark gray/green colored fluid was removed. IMPRESSION: CT-guided placement of a drainage catheter within the lower anterior abdominal abscess. Findings are suggestive for a bowel leak based on the consistency of the intra-abdominal fluid collection. Electronically Signed   By: Richarda Overlie M.D.   On: 05/26/2018 19:27    Labs:  CBC: Recent Labs    05/21/18 0934 05/24/18 1620 05/26/18 0615 05/27/18 1003  WBC 13.7* 19.7* 21.0* 22.6*  HGB 8.7* 7.3* 6.5* 8.1*  HCT 29.3* 24.4* 21.5* 26.5*  PLT 1,005* 735* 656* 667*    COAGS: Recent Labs    05/26/18 0648  INR 1.23    BMP: Recent Labs    05/15/18 0514 05/21/18 0934 05/26/18 0615 05/27/18 1003  NA 134* 137 136 137  K 4.1 4.1 4.1 4.2  CL 98* 102 101 102  CO2 30 28 27 29   GLUCOSE 145* 146* 171* 145*  BUN 12 15 15 13   CALCIUM 7.8* 8.3* 7.9* 7.7*  CREATININE 0.38* 0.42* 0.39* 0.41*  GFRNONAA >60 >60 >60 >60  GFRAA >60 >60 >60 >60    LIVER FUNCTION TESTS: Recent Labs    05/06/18 0646 05/09/18 0535 05/12/18 0530 05/15/18 0514  BILITOT 0.2* 0.3 0.4 0.3  AST 13* 13* 12* 12*  ALT 10* 8* 8* 7*  ALKPHOS 63 72 73 75  PROT 4.6* 5.2* 5.0* 5.1*  ALBUMIN 1.2* 1.4* 1.4* 1.5*    Assessment and Plan: Appendiceal carcinoma s/p resection with development of anastomotic leak. Now with multiple [facility].  LLQ drain previously removed by IR due to hydronephrosis from compression on  ureter. Repeat imaging demonstrated recurrence of fluid collection now s/p RLQ drain placement into the left abdomen. Patient resting comfortably.  Drain intact.  Insertion site c/d/i.  Output remains dark grey with flecks.  Continue routine drain care.   Electronically Signed: Hoyt KochKacie Sue-Ellen Delany Steury, PA 05/27/2018, 3:02 PM   I spent a total of 15 Minutes at the the patient's bedside AND on the patient's hospital floor or unit, greater than 50% of which was counseling/coordinating care for intra-abdominal fluid collection.

## 2018-05-28 ENCOUNTER — Other Ambulatory Visit (HOSPITAL_COMMUNITY): Payer: Medicare Other

## 2018-05-29 LAB — BASIC METABOLIC PANEL
Anion gap: 7 (ref 5–15)
BUN: 12 mg/dL (ref 6–20)
CALCIUM: 7.8 mg/dL — AB (ref 8.9–10.3)
CO2: 25 mmol/L (ref 22–32)
CREATININE: 0.36 mg/dL — AB (ref 0.44–1.00)
Chloride: 103 mmol/L (ref 101–111)
Glucose, Bld: 133 mg/dL — ABNORMAL HIGH (ref 65–99)
Potassium: 3.9 mmol/L (ref 3.5–5.1)
SODIUM: 135 mmol/L (ref 135–145)

## 2018-05-29 LAB — CBC
HCT: 26.8 % — ABNORMAL LOW (ref 36.0–46.0)
Hemoglobin: 8.1 g/dL — ABNORMAL LOW (ref 12.0–15.0)
MCH: 25.4 pg — AB (ref 26.0–34.0)
MCHC: 30.2 g/dL (ref 30.0–36.0)
MCV: 84 fL (ref 78.0–100.0)
PLATELETS: 653 10*3/uL — AB (ref 150–400)
RBC: 3.19 MIL/uL — ABNORMAL LOW (ref 3.87–5.11)
RDW: 17.9 % — AB (ref 11.5–15.5)
WBC: 24.5 10*3/uL — ABNORMAL HIGH (ref 4.0–10.5)

## 2018-05-30 ENCOUNTER — Other Ambulatory Visit (HOSPITAL_COMMUNITY): Payer: Medicare Other

## 2018-05-30 MED ORDER — IOHEXOL 300 MG/ML  SOLN
100.0000 mL | Freq: Once | INTRAMUSCULAR | Status: AC | PRN
  Administered 2018-05-30: 100 mL via INTRAVENOUS

## 2018-05-30 NOTE — Progress Notes (Signed)
Referring Physician(s): Dr. Manson PasseyBrown  Supervising Physician: Irish LackYamagata, Summer  Patient [facility]:  [facility]  Chief Complaint:  Appendical carcinoma with bdominal abscess  Subjective:  Patient feels ok today. In good spirits. Hopefull for drain removal S/P image guided drain placement 6/6  Allergies: Patient has no allergy information on record.  Medications: Prior to Admission medications   Not on File     Vital Signs: BP (!) 96/49   Pulse 84   Resp 18   SpO2 98%   Physical Exam Awake and alert Sitting up in chair Pain with flushing today A little blood tinged serous output, only about 20 mL in bag.  Imaging: [place] Renal  Result Date: 05/28/2018 CLINICAL DATA:  Hydronephrosis. EXAM: RENAL / URINARY TRACT ULTRASOUND COMPLETE COMPARISON:  Ultrasound May 18, 2018. CT abdomen and pelvis May 25, 2018 FINDINGS: Right Kidney: Length: 10.4 cm. Echogenicity within normal limits. No mass or hydronephrosis visualized. Left Kidney: Length: 11 cm. No gross abnormality but ultrasound technologist reports difficulty of visualization. Bladder: Appears normal for degree of bladder distention. IMPRESSION: No definite right hydronephrosis is identified on ultrasound. Electronically Signed   By: Sherian ReinWei-Chen  Lin M.D.   On: 05/28/2018 18:47   Ct Image Guided Drainage By Percutaneous Catheter  Result Date: 05/26/2018 INDICATION: 72 year old with a complex surgical history including appendiceal carcinoma and subsequent development of intestinal anastomotic leak. Patient has surgical drains with intermittent fevers and fullness in the lower abdomen. Recent CT demonstrates a recurrent abscess in the lower abdomen. EXAM: CT-GUIDED DRAINAGE OF LOWER ABDOMINAL ABSCESS MEDICATIONS: Sedation medications. ANESTHESIA/SEDATION: 2.0 mg IV Versed 75 mcg IV Fentanyl Moderate Sedation Time:  19 minutes The patient was continuously monitored during the procedure by the interventional radiology nurse under my direct  supervision. COMPLICATIONS: None immediate. TECHNIQUE: Informed written consent was obtained from the patient after a thorough discussion of the procedural risks, benefits and alternatives. All questions were addressed. A timeout was performed prior to the initiation of the procedure. PROCEDURE: Patient was placed supine on the CT scanner. Images through the lower abdomen and pelvis were obtained. The right lower abdomen was prepped with chlorhexidine and sterile field was created. Skin and soft tissues were anesthetized with 1% lidocaine. 18 gauge trocar needle was directed into the air-fluid collection with CT guidance and dark green/gray colored fluid was aspirated. A stiff Amplatz wire was placed the tract was dilated to accommodate a 10 [place] drain. 80 mL of the fluid was removed. Catheter was attached to a suction bulb. Catheter was sutured to the skin. FINDINGS: Large air-fluid collection in the anterior lower abdomen. 80 mL of dark gray/green colored fluid was removed. IMPRESSION: CT-guided placement of a drainage catheter within the lower anterior abdominal abscess. Findings are suggestive for a bowel leak based on the consistency of the intra-abdominal fluid collection. Electronically Signed   By: Richarda OverlieAdam  Henn M.D.   On: 05/26/2018 19:27    Labs:  CBC: Recent Labs    05/24/18 1620 05/26/18 0615 05/27/18 1003 05/29/18 0749  WBC 19.7* 21.0* 22.6* 24.5*  HGB 7.3* 6.5* 8.1* 8.1*  HCT 24.4* 21.5* 26.5* 26.8*  PLT 735* 656* 667* 653*    COAGS: Recent Labs    05/26/18 0648  INR 1.23    BMP: Recent Labs    05/21/18 0934 05/26/18 0615 05/27/18 1003 05/29/18 0749  NA 137 136 137 135  K 4.1 4.1 4.2 3.9  CL 102 101 102 103  CO2 28 27 29 25   GLUCOSE 146*  171* 145* 133*  BUN 15 15 13 12   CALCIUM 8.3* 7.9* 7.7* 7.8*  CREATININE 0.42* 0.39* 0.41* 0.36*  GFRNONAA >60 >60 >60 >60  GFRAA >60 >60 >60 >60    LIVER FUNCTION TESTS: Recent Labs    05/06/18 0646 05/09/18 0535  05/12/18 0530 05/15/18 0514  BILITOT 0.2* 0.3 0.4 0.3  AST 13* 13* 12* 12*  ALT 10* 8* 8* 7*  ALKPHOS 63 72 73 75  PROT 4.6* 5.2* 5.0* 5.1*  ALBUMIN 1.2* 1.4* 1.4* 1.5*    Assessment and Plan:  Appendiceal carcinoma s/p resection with development of anastomotic leak.   Multiple drains in place from [facility].   LLQ drain previously removed by IR due to hydronephrosis from compression on ureter.  RLQ drain in place.   CT scan ordered/pending to evaluate for possible removal of the RLQ drain.  Electronically Signed: Gwynneth Macleod, PA-C 05/30/2018, 2:33 PM    I spent a total of 15 Minutes at the the patient's bedside AND on the patient's hospital floor or unit, greater than 50% of which was counseling/coordinating care for follow up drain.

## 2018-05-31 LAB — AEROBIC/ANAEROBIC CULTURE W GRAM STAIN (SURGICAL/DEEP WOUND)

## 2018-05-31 LAB — CBC
HEMATOCRIT: 26.5 % — AB (ref 36.0–46.0)
Hemoglobin: 7.8 g/dL — ABNORMAL LOW (ref 12.0–15.0)
MCH: 25 pg — AB (ref 26.0–34.0)
MCHC: 29.4 g/dL — AB (ref 30.0–36.0)
MCV: 84.9 fL (ref 78.0–100.0)
PLATELETS: 732 10*3/uL — AB (ref 150–400)
RBC: 3.12 MIL/uL — ABNORMAL LOW (ref 3.87–5.11)
RDW: 17.9 % — ABNORMAL HIGH (ref 11.5–15.5)
WBC: 18.6 10*3/uL — ABNORMAL HIGH (ref 4.0–10.5)

## 2018-05-31 NOTE — Consult Note (Signed)
Chief Complaint: Patient was seen in consultation today for RLQ and LUQ abscess exchange/replacements at the request of Dr Sharyon MedicusHijazi   Supervising Physician: Malachy MoanMcCullough, Heath  Patient Status: [facility]  History of Present Illness: Summer Espinoza is a 72 y.o. 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 RLQ and LUQ abscesses remain. OP minimal for both LUQ drain is a surgical drain that cannot be flushed Dr Archer AsaMcCullough recommends new drains; replacements  Pt and family agreeable to plan procedure for 6/12   No past medical history on file.   Allergies: Patient has no allergy information on record.  Medications: Prior to Admission medications   Not on File     No family history on file.  Social History   Socioeconomic History  . Marital status: Married    Spouse name: Not on file  . Number of children: Not on file  . Years of education: Not on file  . Highest education level: Not on file  Occupational History  . Not on file  Social Needs  . Financial resource strain: Not on file  . Food insecurity:    Worry: Not on file    Inability: Not on file  . Transportation needs:    Medical: Not on file    Non-medical: Not on file  Tobacco Use  . Smoking status: Not on file  Substance and Sexual Activity  . Alcohol use: Not on file  . Drug use: Not on file  . Sexual activity: Not on file  Lifestyle  . Physical activity:    Days per week: Not on file    Minutes per session: Not on file  . Stress: Not on file  Relationships  . Social connections:    Talks on phone: Not on file    Gets together: Not on  file    Attends religious service: Not on file    Active member of club or organization: Not on file    Attends meetings of clubs or organizations: Not on file    Relationship status: Not on file  Other Topics Concern  . Not on file  Social History Narrative  . Not on file     Review of Systems: A 12 point ROS discussed and pertinent positives are indicated in the HPI above.  All other systems are negative.  Review of Systems  Gastrointestinal: Positive for abdominal pain and nausea.  Neurological: Positive for weakness.  Psychiatric/Behavioral: Negative for behavioral problems and confusion.    Vital Signs: BP (!) 96/49   Pulse 84   Resp 18   SpO2 98%   Physical Exam  Constitutional: She is oriented to person, place, and time.  Cardiovascular: Normal rate and regular rhythm.  Pulmonary/Chest: Effort normal and breath sounds normal.  Abdominal: Soft. Bowel sounds are normal. There is tenderness.  Musculoskeletal: Normal range of motion.  Neurological: She is alert and oriented to person, place, and time.  Skin: Skin is warm and dry.  Psychiatric: She has a normal mood and affect. Her behavior is normal. Judgment and thought content normal.  Nursing note and vitals reviewed.   Imaging: Ct Abdomen Pelvis W Contrast  Result Date: 05/30/2018 CLINICAL DATA:  Generalized abdominal pain, percutaneous drains, bleeding from drain catheter EXAM: CT ABDOMEN AND PELVIS WITH CONTRAST TECHNIQUE: Multidetector CT imaging of the abdomen and pelvis was performed using the standard protocol following bolus administration of intravenous contrast. CONTRAST:  OMNIPAQUE IOHEXOL 300 MG/ML  SOLN COMPARISON:  05/25/2018, 05/26/2018 FINDINGS: Lower chest: Small left pleural effusion. Bibasilar atelectasis noted. Normal heart size. No pericardial effusion. Hepatobiliary: Stable subcentimeter hypodensity in the central right liver. No new hepatic abnormality or biliary dilatation. Hepatic portal  veins remain patent. Pancreas: Stable 5 mm small cystic structure of the pancreatic tail. No ductal dilatation. No surrounding no new abnormality. Spleen: Previous splenectomy. Again, suspect splenosis in the left upper quadrant along the pancreatic tail. Adrenals/Urinary Tract: Normal adrenal glands. No focal renal abnormality or hydronephrosis. Mild right pelviectasis. No obstruction process. Ureters difficult to follow into the pelvis but no gross obstructing ureteral calculus. Urinary bladder stable in appearance. Stomach/Bowel: Small amount of free air beneath the left hemidiaphragm as before, image 12. Similar ill-defined fluid surrounding the stomach in the left upper quadrant Negative for bowel obstruction. Nonspecific mild gas distention of small bowel. Exam is limited without enteric contrast. No significant interval change. Vascular/Lymphatic: Atherosclerosis of the aorta. Negative for aneurysm or occlusive process. No retroperitoneal hematoma. No adenopathy. Reproductive: Uterus not visualized.  No adnexal mass Other: Interval percutaneous drainage of the large pelvic U shaped air-fluid collection with peripheral enhancement. Dominant component of fluid collection is smaller measuring 11.8 cm in width and 3.6 cm in AP diameter, image 62 following the drain placement. Persistent abscess cavity in the left upper quadrant which is subdiaphragmatic containing a drain catheter. This also measures slightly smaller, 7.2 x 4.3 cm, previously 8.6 x 4.7 cm. Right upper quadrant drainage catheter along the duodenum demonstrate no significant residual fluid collection. Drain catheter position is stable. Smaller air-fluid collection between the bladder and rectum, image 69 measuring 7.0 x 1.2 cm, previously 6.3 by 3.4 cm. No new abdominal or pelvic collections. Stable fluid along both inguinal canals. Intact abdominal wall.  No hernia. Musculoskeletal: Degenerative changes of the spine. Lower lumbar facet  arthropathy. No acute osseous finding. IMPRESSION: Slightly smaller left upper quadrant subdiaphragmatic abscess containing a drain catheter which is stable in position. Right upper quadrant drain catheter position along the duodenum is stable. No residual abscess in this region. Large U shaped anterior pelvic abscess status post percutaneous drainage is slightly smaller, measurements as above. stable to slightly smaller air-fluid collection in the cul-de-sac, posterior to the bladder and anterior to the rectum. Stable ill-defined left upper quadrant fluid surrounding the stomach and anterior to the pancreas. Stable pneumoperitoneum No new abdominopelvic fluid collections or acute hematoma. Electronically Signed   By: Judie Petit.  Shick M.D.   On: 05/30/2018 19:42   Ct Abdomen Pelvis W Contrast  Result Date: 05/25/2018 CLINICAL DATA:  Abdominal pain and fever. Patient with history of intra-abdominal collections and drains. Increased white blood cell count. EXAM: CT ABDOMEN AND PELVIS WITH CONTRAST TECHNIQUE: Multidetector CT imaging of the abdomen and pelvis was performed using the standard protocol following bolus administration of intravenous contrast. CONTRAST:  OMNIPAQUE IOHEXOL 300 MG/ML  SOLN COMPARISON:  CT 05/14/2018 FINDINGS: Lower chest: Small left pleural effusion and adjacent atelectasis, slightly improved from prior exam. Mild right lung base atelectasis. Hepatobiliary: Subcentimeter low-density in the central liver is unchanged. No new focal lesion. Gallbladder not confidently visualized. Pancreas: No ductal dilatation or inflammation. Tiny 4 mm low-density cystic structure  in the tail is unchanged. Spleen: Prior splenectomy. Possible splenosis in the left upper quadrant versus pancreatic tail. Adrenals/Urinary Tract: No adrenal nodule. Mild right hydronephrosis and proximal ureteral dilatation to the pelvic brim, however there is symmetric renal enhancement and excretion on delayed phase imaging. The  ureter distal to the pelvic brim is not well evaluated. Nonobstructing stone in the lower left kidney. Urinary bladder is physiologically distended. Stomach/Bowel: Postsurgical change in lack of enteric contrast significantly limits bowel evaluation. Bowel anatomy is not well-defined. Stomach is nondistended. Fluid-filled prominent but not particularly dilated small bowel in the central abdomen. Enteric sutures noted in the transverse colon. Liquid stool/fluid-filled colon without colonic wall thickening or inflammatory change. There is an apparent colonic wall defect within the distal sigmoid colon, coronal image 56 and sagittal image 51, with apparent extraluminal air and fluid collection, this collection measures approximately 6.3 x 3.4 x 7.5 cm. This is in the region of prior pelvic drain, which has been removed in the interim. Vascular/Lymphatic: Aortic atherosclerosis. Circumaortic left renal vein. Overall limited assessment for adenopathy. Reproductive: Uterus is not visualized. Other: Large peripherally enhancing U shaped pelvic fluid collection measures at least 15.5 x 4.1 x 4.9 cm, in the region of prior surgical drain consistent with abscess. This fluid collection tracks into the left pericolic gutter and left upper quadrant, and is in continuity with the left upper quadrant drainage catheter. Abscess cavity in the left upper quadrant measures 8.6 x 4.7 cm. Right lateral aspect of the fluid collection tracks into the right pericolic gutter. Separate drainage catheter in the right upper quadrant has small residual fluid collection measuring 2.5 x 2.0 cm. There is scattered free air in the anterior upper abdomen. Fluid tracks into both inguinal canals, sterility indeterminate by CT. Midline skin staples. Musculoskeletal: Degenerative change in the spine. No focal bone lesion. IMPRESSION: 1. Large recurrent pelvic abscess after drainage catheter removal. The abscess is U shaped and measures at least 15 x 4 x  5 cm. The left lateral aspect of this abscess is in continuity with a fluid collection tracking in the pericolic gutter into the left upper quadrant. There is a drainage catheter within the left upper quadrant fluid collection with residual abscess cavity of 8.6 x 4.7 cm. 2. Right upper quadrant drainage catheter with small residual adjacent fluid collection measuring 2.5 x 2.0 cm. 3. Deep pelvic abscess posterior to the bladder measures 6.3 x 3.4 x 7.5 cm. 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 4. Persistent mild right hydroureteronephrosis to the pelvic brim, raising possibility of ureteral stricture. These results will be called to the ordering clinician or representative by the Radiologist Assistant, and communication documented in the PACS or zVision Dashboard. Electronically Signed   By: Rubye Oaks M.D.   On: 05/25/2018 01:16   Ct Abdomen Pelvis W Contrast  Result Date: 05/14/2018 CLINICAL DATA:  Patient with recent placement of drains for abdominal collections. Patient with abdominal pain. EXAM: CT ABDOMEN AND PELVIS WITH CONTRAST TECHNIQUE: Multidetector CT imaging of the abdomen and pelvis was performed using the standard protocol following bolus administration of intravenous contrast. CONTRAST:  OMNIPAQUE IOHEXOL 300 MG/ML  SOLN COMPARISON:  05/08/2018 FINDINGS: Lower chest: Small left pleural effusion. Minimal right pleural effusion. Left greater than right lower lobe dependent atelectasis. Heart normal in size. Hepatobiliary: Small cystic appearing lesion measuring 6 mm in the central liver. No other liver mass focal lesion. Gallbladder not visualized. It is  either collapsed or surgically absent. No bile duct dilation. Pancreas: Unremarkable. No pancreatic ductal dilatation or surrounding inflammatory changes. Spleen: Spleen surgically absent. Adrenals/Urinary Tract: No adrenal masses. Kidneys are normal in size, orientation  and position. There is symmetric renal enhancement. No renal masses. Small nonobstructing stones noted in the lower poles of each kidney. There is moderate right hydronephrosis. Proximal to mid right ureter is mildly dilated. Ureter contacts the drainage 2 the right upper pelvis at the level of the iliac vessels. This may be causing extrinsic compression of the ureter. No ureteral stone. No left hydronephrosis. Left ureter is normal course and in caliber. Stomach/Bowel/mesentery and peritoneal cavity: The previously seen collection in the left upper quadrant has been mostly evacuated with a drainage catheter. A pigtail catheter lies along the anterior pararenal fascia on the right. There is no associated collection. There is a drainage catheter that curls within the pelvis. The previously seen collection and significant extraluminal air are decreased in size. There is a small collection along the right posterolateral liver measuring approximately 4.8 x 1.7 x 4.2 cm, without significant change. There is a small amount of free intraperitoneal air decreased in amount from the prior CT. Stomach is decompressed. No small bowel dilation. No wall thickening. No colonic dilation. There is mild wall thickening along the left colon. Several sigmoid diverticula are noted. Vascular/Lymphatic: Aortic atherosclerosis. No enlarged abdominal or pelvic lymph nodes. Reproductive: Unremarkable Other: Small amount of ascites. Musculoskeletal: No fracture or acute finding. IMPRESSION: 1. Previously described abdominal collections have improved. In the left upper quadrant underneath the left hemidiaphragm, the previously defined collection has been mostly evacuated with a surgical drain. The collection in the anterior pelvis is decreased in size. The associated surgical drain is stable in position. There is a stable pigtail catheter resting against the right anterior pararenal fascia, with no associated collection. 2. There are no new  collections. There is a persistent collection adjacent to the right posterolateral liver. 3. Small amount of ascites, which is similar to the prior study. There has been interval decrease in the small amount of free air. 4. Right hydronephrosis and proximal hydroureter. Hydronephrosis has increased in severity since the prior CT. Ureteral dilation extends to the level of the iliac vessels where the drainage catheter crosses over the anterior margin of the ureter. The drainage catheter may be causing partial obstruction, or this may be due to the adjacent inflammation. Electronically Signed   By: Amie Portland M.D.   On: 05/14/2018 18:30   Ct Abdomen Pelvis W Contrast  Result Date: 05/08/2018 CLINICAL DATA:  Abdominal pain.  Abscess suspected. EXAM: CT ABDOMEN AND PELVIS WITH CONTRAST TECHNIQUE: Multidetector CT imaging of the abdomen and pelvis was performed using the standard protocol following bolus administration of intravenous contrast. CONTRAST:  OMNIPAQUE IOHEXOL 300 MG/ML  SOLN COMPARISON:  None. FINDINGS: Lower chest: Small bilateral pleural effusions with compressive atelectasis. Heart is normal size. Hepatobiliary: No focal hepatic abnormality. Gallbladder not visualized, presumably prior cholecystectomy. Pancreas: No focal abnormality or ductal dilatation. Spleen: Surgical drain in the left upper quadrant. Prior splenectomy. Adrenals/Urinary Tract: Mild fullness of the right renal collecting system and ureter. Ureters difficult to follow near the pelvic brim as a surgical drain crosses its course in the right lower quadrant. The distal right ureter is not visualized. No hydronephrosis on the left. Punctate nonobstructing stone in the lower pole of the left kidney. Adrenal glands and urinary bladder unremarkable. Stomach/Bowel: No evidence of bowel obstruction. Vascular/Lymphatic: Heavily calcified  aorta. No aneurysm or adenopathy. Reproductive: Uterus and adnexa unremarkable.  No mass. Other:  Surgical drains noted in the pelvis and left upper quadrant. Pigtail drainage catheter noted in the right upper abdomen, coiled in the anterior pararenal space on the right. There is gas and fluid noted around the drainage catheter in the left upper quadrant which could reflect abscess. This measures approximately 8 x 3.8 cm on image 17. Gas and fluid noted around the surgical drain in the pelvis is well with large gas collection noted anterior to the bladder. Free air also noted in the upper abdomen. Complex fluid collections noted in the left upper abdomen adjacent to the stomach and tail of the pancreas. Small amount of free fluid in the pelvis. Bilateral inguinal hernias containing fluid. Musculoskeletal: No acute bony abnormality. Degenerative disc and facet disease in the lower lumbar spine. IMPRESSION: Surgical drains in the abdomen and pelvis as above. There are gas and fluid collections noted around the left upper quadrant surgical drain and the pelvic drain. These could reflect abscesses. Complex fluid collection also noted adjacent to the stomach and tail of the pancreas. Mild fullness of the right renal collecting system and proximal ureter. The right ureter is not visible beyond the pelvic brim. Question mass-effect from the surgical drain as it crosses the blood vessels and ureter at the pelvic brim. No visible stones. Small bilateral pleural effusions with compressive atelectasis in the lower lobes. Electronically Signed   By: Charlett Nose M.D.   On: 05/08/2018 16:47   Ir Sinus/fist Tube Chk-non Gi  Result Date: 05/21/2018 CLINICAL DATA:  Complex postoperative history performed at outside institution, now with percutaneous and surgical drainage catheters. Note, the right-sided surgical drainage catheter has been removed as it was resulting in right-sided ureterectasis and pelvicaliectasis. While imaging demonstrates no residual fluid collection surrounding the right upper abdominal quadrant  percutaneous drainage catheter, there is been persistent high volume output (approximately 300-500 cc) per day of somewhat bilious appearing fluid. As such, patient presents now for fluoroscopic guided drainage catheter injection EXAM: SINUS TRACT INJECTION/FISTULOGRAM COMPARISON:  CT abdomen and pelvis-05/14/2018; 05/08/2018 CONTRAST:  20 cc Isovue-300 FLUOROSCOPY TIME:  1 minutes 24 seconds (30 mGy) TECHNIQUE: The patient was positioned supine on the fluoroscopy table. A preprocedural spot fluoroscopic image was obtained of the right mid hemiabdomen and existing percutaneous drainage catheter Multiple spot fluoroscopic and radiographic images were obtained following the injection of a small amount of contrast via the existing percutaneous drainage catheter. Images reviewed and the procedure was terminated. The drainage catheter was flushed with a small amount of saline and converted from a suction canister to a gravity bag. A dressing was placed. The patient tolerated the procedure well without immediate postprocedural complication. FINDINGS: Preprocedural spot fluoroscopic image demonstrates unchanged positioning of percutaneous drainage catheter overlying the right mid hemiabdomen. Contrast injection demonstrates opacification of a presumed decompressed abscess cavity peripheral to the drainage catheter coil. Delayed imaging was obtained and was negative for definitive peristalsis of this presumed decompressed abscess cavity. There is no definitive communication between this presumed decompressed abscess cavity in the adjacent biliary system. IMPRESSION: No definitive evidence of fistulous connection between the presumed decompressed abscess cavity and either the adjacent intestines or biliary system. PLAN: - Given report of persistent high volume daily output from the percutaneous drainage catheter estimated approximately 300 - 500 cc per day, drainage catheter removal is not advised at this time. - As such, we  will convert the patient's drainage catheter suction canister  to a gravity bag and will continue to monitor the daily output from the drainage catheter. - If the drainage catheter output demonstrates a precipitous drop-off in the coming days, the drainage catheter may be removed at that time. - If drainage catheter output remains voluminous, would recommend repeat CT scan and potential repeat drainage catheter injection prior to consideration of drainage catheter removal. Electronically Signed   By: Simonne Come M.D.   On: 05/21/2018 12:01   US Renal  Result Date: 05/28/2018 CLINICAL DATA:  Hydronephrosis. EXAM: RENAL / URINARY TRACT ULTRASOUND COMPLETE COMPARISON:  Ultrasound May 18, 2018. CT abdomen and pelvis May 25, 2018 FINDINGS: Right Kidney: Length: 10.4 cm. Echogenicity within normal limits. No mass or hydronephrosis visualized. Left Kidney: Length: 11 cm. No gross abnormality but ultrasound technologist reports difficulty of visualization. Bladder: Appears normal for degree of bladder distention. IMPRESSION: No definite right hydronephrosis is identified on ultrasound. Electronically Signed   By: Sherian Rein M.D.   On: 05/28/2018 18:47   US Renal  Result Date: 05/18/2018 CLINICAL DATA:  Evaluate right-sided hydronephrosis. EXAM: RENAL / URINARY TRACT ULTRASOUND COMPLETE COMPARISON:  CT of the abdomen and pelvis performed 05/14/2018 FINDINGS: Right Kidney: Length: 9.3 cm. Echogenicity within normal limits. No mass or hydronephrosis visualized. Left Kidney: Length: 11.2 cm. Echogenicity within normal limits. No mass or hydronephrosis visualized. Bladder: Decompressed, with a Foley catheter in place. IMPRESSION: Previously noted hydronephrosis has resolved. Unremarkable renal ultrasound. Electronically Signed   By: Roanna Raider M.D.   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 Patient has surgical drains with intermittent fevers and fullness in the lower abdomen. Recent CT demonstrates a recurrent abscess in the lower abdomen. EXAM: CT-GUIDED DRAINAGE OF LOWER ABDOMINAL ABSCESS MEDICATIONS: Sedation medications. ANESTHESIA/SEDATION: 2.0 mg IV Versed 75 mcg IV Fentanyl Moderate Sedation Time:  19 minutes The patient was continuously monitored during the procedure by the interventional radiology nurse under my direct supervision. COMPLICATIONS: None immediate. TECHNIQUE: Informed written consent was obtained from the patient after a thorough discussion of the procedural risks, benefits and alternatives. All questions were addressed. A timeout was performed prior to the initiation of the procedure. PROCEDURE: Patient was placed supine on the CT scanner. Images through the lower abdomen and pelvis were obtained. The right lower abdomen was prepped with chlorhexidine and sterile field was created. Skin and soft tissues were anesthetized with 1% lidocaine. 18 gauge trocar needle was directed into the air-fluid collection with CT guidance and dark green/gray colored fluid was aspirated. A stiff Amplatz wire was placed the tract was dilated to accommodate a 10 [place] drain. 80 mL of the fluid was removed. Catheter was attached to a suction bulb. Catheter was sutured to the skin. FINDINGS: Large air-fluid collection in the anterior lower abdomen. 80 mL of dark gray/green colored fluid was removed. IMPRESSION: CT-guided placement of a drainage catheter within the lower anterior abdominal abscess. Findings are suggestive for a bowel leak based on the consistency of the intra-abdominal fluid collection. Electronically Signed   By: Richarda Overlie M.D.   On: 05/26/2018 19:27    Labs:  CBC: Recent Labs    05/26/18 0615 05/27/18 1003 05/29/18 0749 05/31/18 0807  WBC 21.0* 22.6* 24.5* 18.6*  HGB 6.5* 8.1* 8.1* 7.8*    HCT 21.5* 26.5* 26.8* 26.5*  PLT 656* 667* 653* 732*  COAGS: Recent Labs    05/26/18 0648  INR 1.23    BMP: Recent Labs    05/21/18 0934 05/26/18 0615 05/27/18 1003 05/29/18 0749  NA 137 136 137 135  K 4.1 4.1 4.2 3.9  CL 102 101 102 103  CO2 28 27 29 25   GLUCOSE 146* 171* 145* 133*  BUN 15 15 13 12   CALCIUM 8.3* 7.9* 7.7* 7.8*  CREATININE 0.42* 0.39* 0.41* 0.36*  GFRNONAA >60 >60 >60 >60  GFRAA >60 >60 >60 >60    LIVER FUNCTION TESTS: Recent Labs    05/06/18 0646 05/09/18 0535 05/12/18 0530 05/15/18 0514  BILITOT 0.2* 0.3 0.4 0.3  AST 13* 13* 12* 12*  ALT 10* 8* 8* 7*  ALKPHOS 63 72 73 75  PROT 4.6* 5.2* 5.0* 5.1*  ALBUMIN 1.2* 1.4* 1.4* 1.5*    TUMOR MARKERS: No results for input(s): AFPTM, CEA, CA199, CHROMGRNA in the last 8760 hours.  Assessment and Plan:  RLQ and LUQ abscess drains for replacement/exchange in IR tomorrow Risks and benefits discussed with the patient including bleeding, infection, damage to adjacent structures, bowel perforation/fistula connection, and sepsis.  All of the patient's questions were answered, patient is agreeable to proceed. Consent signed and in chart.   Thank you for this interesting consult.  I greatly enjoyed meeting YALONDA SAMPLE and look forward to participating in their care.  A copy of this report was sent to the requesting provider on this date.  Electronically Signed: Robet Leu, PA-C 05/31/2018, 4:27 PM   I spent a total of 40 Minutes    in face to face in clinical consultation, greater than 50% of which was counseling/coordinating care for RLQ and LUQ drains

## 2018-06-01 LAB — PROTIME-INR
INR: 1.29
PROTHROMBIN TIME: 16 s — AB (ref 11.4–15.2)

## 2018-06-02 ENCOUNTER — Other Ambulatory Visit (HOSPITAL_COMMUNITY): Payer: Medicare Other

## 2018-06-02 HISTORY — PX: IR CATHETER TUBE CHANGE: IMG717

## 2018-06-02 MED ORDER — LIDOCAINE HCL (PF) 1 % IJ SOLN
INTRAMUSCULAR | Status: AC | PRN
  Administered 2018-06-02: 5 mL

## 2018-06-02 MED ORDER — FENTANYL CITRATE (PF) 100 MCG/2ML IJ SOLN
INTRAMUSCULAR | Status: AC
  Filled 2018-06-02: qty 2

## 2018-06-02 MED ORDER — SODIUM CHLORIDE 0.9% FLUSH: 5.0000 mL | Freq: Three times a day (TID) | INTRAVENOUS | Status: DC

## 2018-06-02 MED ORDER — LIDOCAINE HCL 1 % IJ SOLN
INTRAMUSCULAR | Status: AC
  Filled 2018-06-02: qty 20

## 2018-06-02 MED ORDER — FENTANYL CITRATE (PF) 100 MCG/2ML IJ SOLN
INTRAMUSCULAR | Status: AC | PRN
  Administered 2018-06-02 (×2): 25 ug via INTRAVENOUS
  Administered 2018-06-02: 50 ug via INTRAVENOUS

## 2018-06-02 MED ORDER — MIDAZOLAM HCL 2 MG/2ML IJ SOLN
INTRAMUSCULAR | Status: AC | PRN
  Administered 2018-06-02 (×2): 1 mg via INTRAVENOUS

## 2018-06-02 MED ORDER — IOPAMIDOL (ISOVUE-300) INJECTION 61%
INTRAVENOUS | Status: AC
  Administered 2018-06-02: 30 mL
  Filled 2018-06-02: qty 50

## 2018-06-02 MED ORDER — MIDAZOLAM HCL 2 MG/2ML IJ SOLN
INTRAMUSCULAR | Status: AC
  Filled 2018-06-02: qty 2

## 2018-06-02 NOTE — Procedures (Signed)
The RLQ drain was injected with contrast.  There is a connection between the lower abdominal collection and the LUQ collection that has a surgical drain.  Catheter and wire were advanced into the LUQ collection with fluoroscopy from the RLQ drain access.  A 14 Fr, 40 cm drainage catheter with additional side holes was placed.  The collections were irrigated with saline and a large amount of cloudy yellow fluid was removed.  Minimal blood loss and no immediate complication.

## 2018-06-03 ENCOUNTER — Encounter (HOSPITAL_COMMUNITY): Payer: Self-pay | Admitting: Diagnostic Radiology

## 2018-06-04 LAB — CBC
HCT: 25.7 % — ABNORMAL LOW (ref 36.0–46.0)
Hemoglobin: 7.4 g/dL — ABNORMAL LOW (ref 12.0–15.0)
MCH: 24.4 pg — ABNORMAL LOW (ref 26.0–34.0)
MCHC: 28.8 g/dL — AB (ref 30.0–36.0)
MCV: 84.8 fL (ref 78.0–100.0)
PLATELETS: 922 10*3/uL — AB (ref 150–400)
RBC: 3.03 MIL/uL — ABNORMAL LOW (ref 3.87–5.11)
RDW: 18 % — AB (ref 11.5–15.5)
WBC: 12.7 10*3/uL — AB (ref 4.0–10.5)

## 2018-06-06 LAB — COMPREHENSIVE METABOLIC PANEL
ALK PHOS: 256 U/L — AB (ref 38–126)
ALT: 56 U/L — ABNORMAL HIGH (ref 14–54)
ANION GAP: 7 (ref 5–15)
AST: 55 U/L — ABNORMAL HIGH (ref 15–41)
Albumin: 1.7 g/dL — ABNORMAL LOW (ref 3.5–5.0)
BILIRUBIN TOTAL: 0.5 mg/dL (ref 0.3–1.2)
BUN: 12 mg/dL (ref 6–20)
CALCIUM: 8.4 mg/dL — AB (ref 8.9–10.3)
CO2: 31 mmol/L (ref 22–32)
Chloride: 101 mmol/L (ref 101–111)
Creatinine, Ser: 0.48 mg/dL (ref 0.44–1.00)
GFR calc Af Amer: >60 mL/min (ref >60)
GFR calc non Af Amer: >60 mL/min (ref >60)
GLUCOSE: 161 mg/dL — AB (ref 65–99)
Potassium: 4.9 mmol/L (ref 3.5–5.1)
Sodium: 139 mmol/L (ref 135–145)
TOTAL PROTEIN: 5.6 g/dL — AB (ref 6.5–8.1)

## 2018-06-06 LAB — TRIGLYCERIDES: TRIGLYCERIDES: 106 mg/dL (ref <150)

## 2018-06-06 LAB — PATHOLOGIST SMEAR REVIEW

## 2018-06-06 LAB — MAGNESIUM: Magnesium: 2.1 mg/dL (ref 1.7–2.4)

## 2018-06-06 LAB — PHOSPHORUS: Phosphorus: 4 mg/dL (ref 2.5–4.6)

## 2018-10-02 IMAGING — XA IR CATHETER TUBE CHANGE
8 series · 11 of 11 positions shown · non-contrast
Comparison: none

INDICATION: 71-year-old with history of appendix cancer and pseudomyxoma
peritonitis. Patient underwent hyperthermic intraperitoneal
chemotherapy at [facility] Patient has
persistent intra-abdominal collections and persistent surgical and
percutaneous drains. Patient continues to have large intra-abdominal
fluid collections despite the drains. Previously, the drains were
putting out dark bilious looking material. Now the drains are
putting out cloudy white/yellow fluid. Patient currently has a left
lateral surgical drain and a right lower quadrant percutaneous
drain.

[Series 1: fl (-) angio · 2 of 2 slices shown (1 of 6)]
[im 1/2]
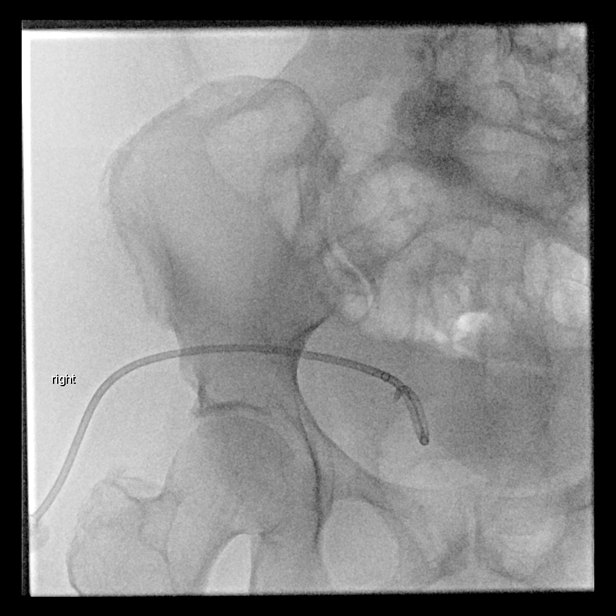
[im 2/2]
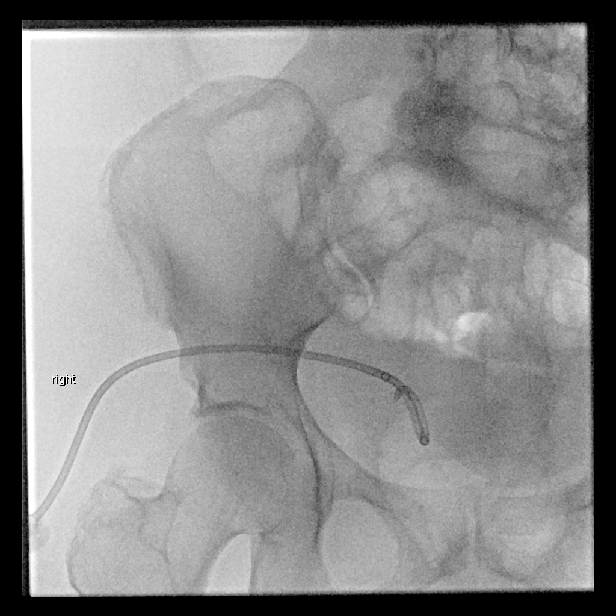

[Series 2: fl (-) angio · 2 of 2 slices shown (2 of 6)]
[im 1/2]
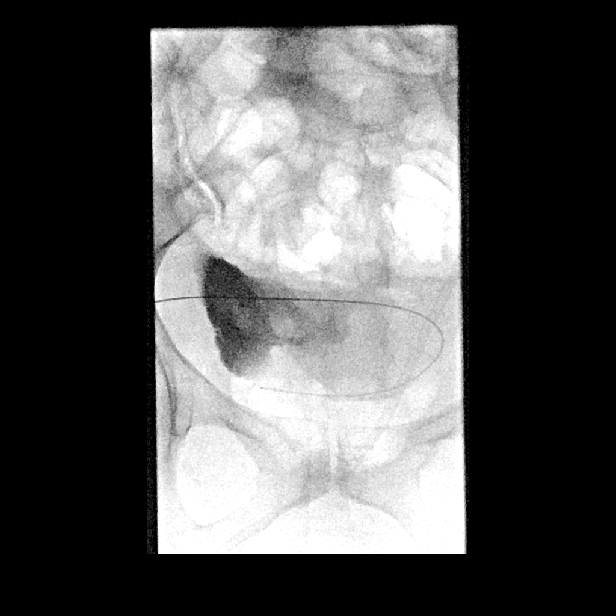
[im 2/2]
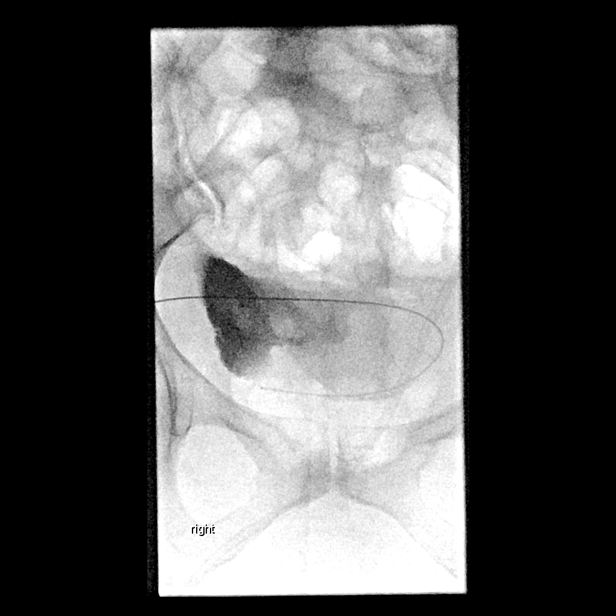

[Series 3: fl (-) angio · 1 of 1 slices shown (3 of 6)]
[im 1/1]
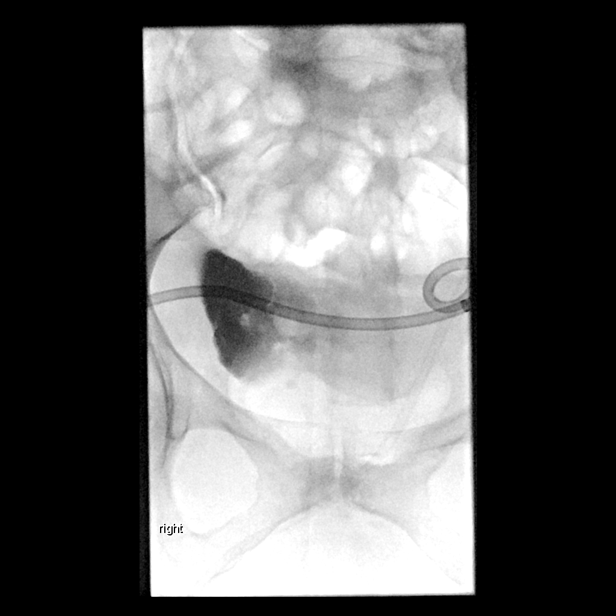

[Series 4: body 4 care · 2 of 2 slices shown]
[im 1/2]
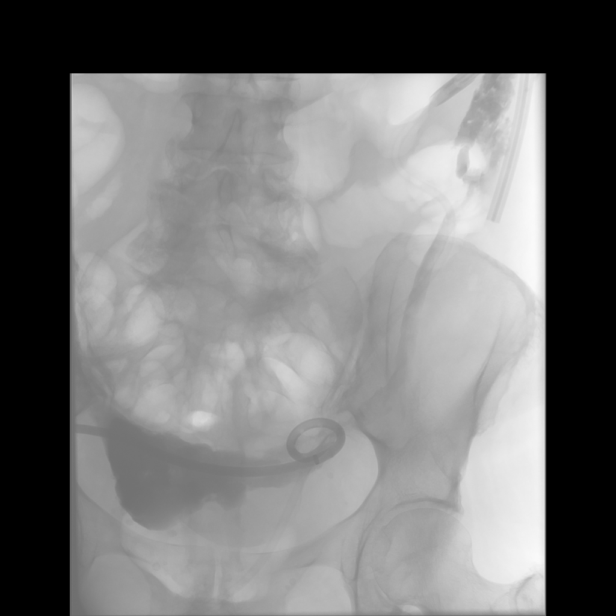
[im 2/2]
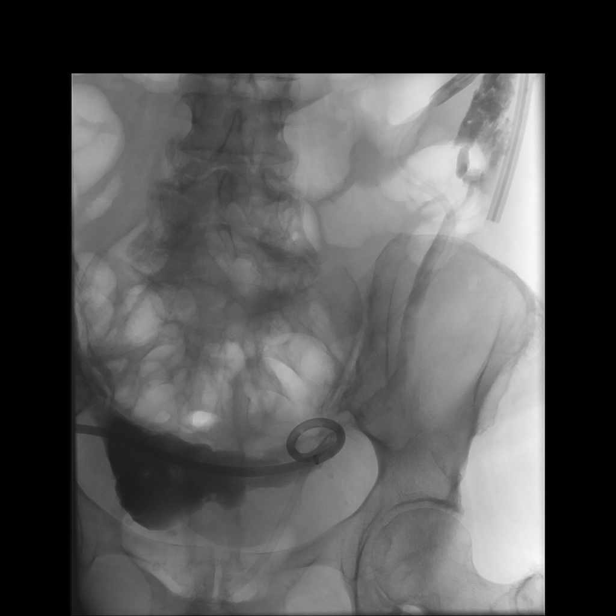

[Series 5: single · 1 of 1 slices shown]
[im 1/1]
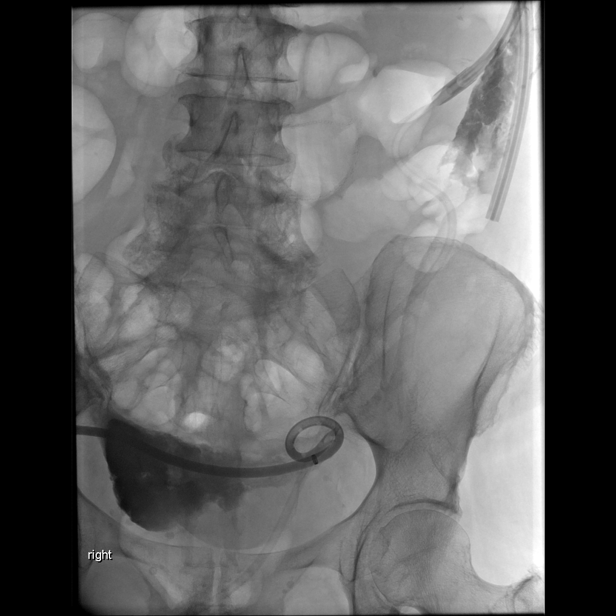

[Series 6: fl (-) angio · 1 of 1 slices shown (4 of 6)]
[im 1/1]
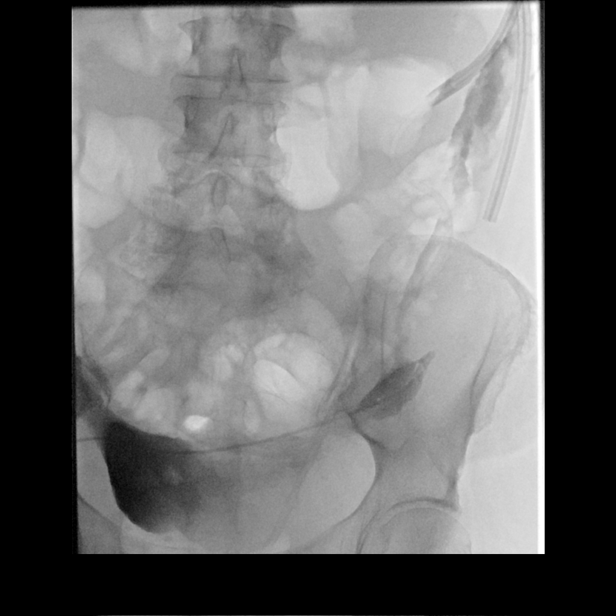

[Series 7: fl (-) angio · 1 of 1 slices shown (5 of 6)]
[im 1/1]
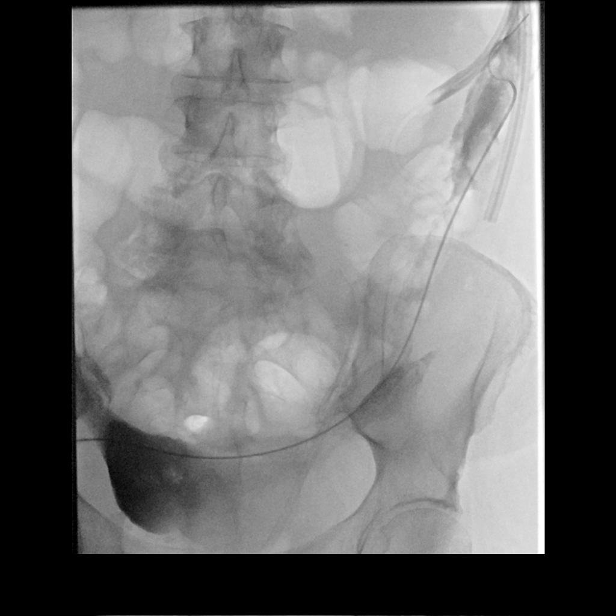

[Series 8: fl (-) angio · 1 of 1 slices shown (6 of 6)]
[im 1/1]
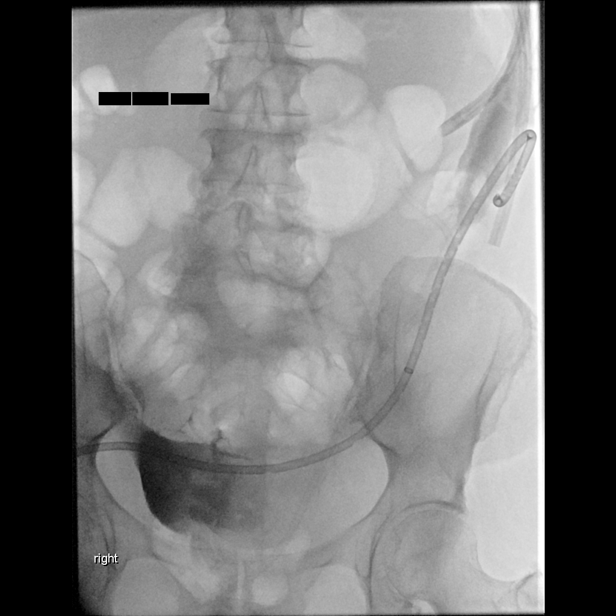

[11 of 11 positions shown; findings below may reference images not displayed]

EXAM:
EXCHANGE AND REPOSITIONING OF PERCUTANEOUS ABDOMINAL DRAIN WITH
FLUOROSCOPY

MEDICATIONS:
The patient is currently admitted to the hospital and receiving
intravenous antibiotics.

ANESTHESIA/SEDATION:
Fentanyl 100 mcg IV; Versed 2 mg IV

Moderate Sedation Time:  34

The patient was continuously monitored during the procedure by the
interventional radiology nurse under my direct supervision.

COMPLICATIONS:
None immediate.

PROCEDURE:
Informed written consent was obtained from the patient after a
thorough discussion of the procedural risks, benefits and
alternatives. All questions were addressed. Maximal Sterile Barrier
Technique was utilized including caps, mask, sterile gowns, sterile
gloves, sterile drape, hand hygiene and skin antiseptic. A timeout
was performed prior to the initiation of the procedure.

Patient was placed supine on the fluoroscopic table. Ultrasound was
used to identify the fluid collection along the left lateral
abdomen. Surgical drain was identified within this collection.
Attention was directed to the existing drain in the right lower
quadrant. This drain was prepped and draped in sterile fashion. Skin
was anesthetized with 1% lidocaine. Drain was injected with
contrast. The drain was cut and removed over a stiff Amplatz wire.
Tract was dilated to accommodate a 14 French multipurpose drain. The
drain was easily advanced into the abdominal fluid collection and
additional contrast was injected. Contrast injection demonstrated a
connection between the lower abdominal/pelvic collection and the
large left lateral collection which contains a surgical drain. This
14 French drain was removed over a Bentson wire and a 5 French Kumpe
catheter was used to advance a wire into the left lateral abdominal
collection. Subsequently, a 14 French x 40 cm biliary tube was
selected. Additional holes were made within the tube. The tube was
advanced over the wire into the abdominal cavity. The end of the
tube was advanced into the left lateral collection and the new side
holes were draining the lower abdominal aspect of the collection.
Thick yellowish fluid was aspirated but the entire collection was
not decompressing. The collections were vigorously irrigated with
normal saline and some additional fluid was obtained. The tube
appear to be draining better with a gravity bag rather than the
suction bulb. Catheter was sutured to skin.
FINDINGS: The intra-abdominal fluid collections output has switched from dark
bilious looking material to thick yellowish fluid. Findings are
suggestive for necrotic tissue. There is a connection between the
large collection in the left upper abdomen and the lower
abdomen/pelvis. A long drain was placed from the right lower
quadrant to cover both the left lateral collection and the pelvic
collection.
IMPRESSION: Successful up sizing and repositioning of the drainage catheter in
the right lower quadrant. There is a connection between the
collection in the pelvis and the left lateral abdomen. The new drain
is covering both areas. Therefore, a new left lateral drain was not
placed at this time. However, the fluid collections are not
decompressing well despite placement of a larger tube. This is most
likely related to the consistency of the fluid. Fluid appears to be
more consistent with necrotic tissue rather than bilious fluid at
this time.
